# Patient Record
Sex: Male | Born: 1945 | Race: Black or African American | Hispanic: No | Marital: Married | Smoking: Former smoker
Health system: Southern US, Community
[De-identification: ages and names within clinical notes are randomized; demographics above are authoritative.]

## PROBLEM LIST (undated history)

## (undated) DIAGNOSIS — M109 Gout, unspecified: Secondary | ICD-10-CM

## (undated) DIAGNOSIS — I10 Essential (primary) hypertension: Secondary | ICD-10-CM

## (undated) DIAGNOSIS — R0602 Shortness of breath: Secondary | ICD-10-CM

## (undated) DIAGNOSIS — M1990 Unspecified osteoarthritis, unspecified site: Secondary | ICD-10-CM

## (undated) DIAGNOSIS — K219 Gastro-esophageal reflux disease without esophagitis: Secondary | ICD-10-CM

## (undated) DIAGNOSIS — G4730 Sleep apnea, unspecified: Secondary | ICD-10-CM

## (undated) DIAGNOSIS — F32A Depression, unspecified: Secondary | ICD-10-CM

## (undated) DIAGNOSIS — F4310 Post-traumatic stress disorder, unspecified: Secondary | ICD-10-CM

## (undated) HISTORY — PX: ABDOMINAL SURGERY: SHX537

## (undated) HISTORY — PX: KNEE ARTHROSCOPY: SUR90

## (undated) HISTORY — DX: Essential (primary) hypertension: I10

## (undated) HISTORY — DX: Post-traumatic stress disorder, unspecified: F43.10

---

## 1964-10-04 DIAGNOSIS — Z77098 Contact with and (suspected) exposure to other hazardous, chiefly nonmedicinal, chemicals: Secondary | ICD-10-CM | POA: Insufficient documentation

## 2006-04-04 HISTORY — PX: COLONOSCOPY: SHX174

## 2012-03-01 ENCOUNTER — Encounter: Payer: Self-pay | Admitting: Gastroenterology

## 2012-03-01 ENCOUNTER — Ambulatory Visit (INDEPENDENT_AMBULATORY_CARE_PROVIDER_SITE_OTHER): Payer: Medicare Other | Admitting: Gastroenterology

## 2012-03-01 VITALS — BP 131/79 | HR 81 | Temp 97.6°F | Ht 72.0 in | Wt 283.0 lb

## 2012-03-01 DIAGNOSIS — R1310 Dysphagia, unspecified: Secondary | ICD-10-CM

## 2012-03-01 DIAGNOSIS — K625 Hemorrhage of anus and rectum: Secondary | ICD-10-CM

## 2012-03-01 MED ORDER — HYDROCORTISONE ACETATE 25 MG RE SUPP: 25.0000 mg | Freq: Two times a day (BID) | RECTAL | Status: DC

## 2012-03-01 MED ORDER — PEG 3350-KCL-NA BICARB-NACL 420 G PO SOLR: 4000.0000 mL | ORAL | Status: DC

## 2012-03-01 NOTE — Assessment & Plan Note (Signed)
In the setting of GERD. Just recently started on Prilosec 40 mg daily. Notes solid food dysphagia and difficulty with water at times. Question secondary to uncontrolled GERD, unable to rule out esophageal web, ring, or stricture. Low likelihood of malignancy.   Proceed with upper endoscopy at time of TCS in the near future with Dr. Darrick Penna. The risks, benefits, and alternatives have been discussed in detail with patient. They have stated understanding and desire to proceed.  PROPOFOL DUE TO ETOH

## 2012-03-01 NOTE — Assessment & Plan Note (Addendum)
67 year old male with hx of polyps on prior colonoscopy, now presenting with low-volume hematochezia in the presence of straining. Denies changes in bowel habits, no rectal pain or discomfort. Hgb 15 per PCP office notes. Last TCS in [place] in 2008; pt notes he was told to update in 5 years. Likely benign anorectal source, but he needs further lower GI evaluation in the near future. Will provide short course of Anusol suppositories in interim.   Proceed with colonoscopy with Dr. Darrick Penna in the near future. The risks, benefits, and alternatives have been discussed in detail with the patient. They state understanding and desire to proceed.  PROPOFOL DUE TO ETOH USE  UPDATE AS OF 1/29: Received op notes and path from [place]. TCS in March 2008 with multiple colon polyps,  path: fragment of serrated adenoma, tublular adenomas

## 2012-03-01 NOTE — Progress Notes (Signed)
Faxed to PCP

## 2012-03-01 NOTE — Patient Instructions (Addendum)
We have set you up for a colonoscopy and upper endoscopy with Dr. Darrick Penna in the near future.  Continue to take Prilosec daily, 30 minutes before breakfast.   I have sent in a prescription for Anusol suppositories to take twice a day for 6 days. This is because of the rectal bleeding.

## 2012-03-01 NOTE — Progress Notes (Addendum)
Primary Care Physician:  Josefa Half, MD Primary Gastroenterologist:  Dr. Darrick Penna   Chief Complaint  Patient presents with  . Rectal Bleeding    HPI:   Tyler 67 year old Hopkins presenting today at the request of Dr. Wende Crease secondary to rectal bleeding. Last TCS in 2008 in [place]. Notes completed at [facility]. Pt states he had 3 small polyps. Believes he was due for surveillance in 5 years. Last Saturday noted blood in stool, has happened one time since. +paper hematochezia and in toilet. States somewhat straining. Denies constipation or diarrhea. No rectal pain, discomfort, itching. Denies hemorrhoid flare. States not really hungry the past few days, not eating a lot. Denies any nausea or vomiting. Went rabbit hunting last week and got light-headed when went from a sitting to standing position. No other symptoms. Hx of hypertension. Decent appetite, wt is stable. Eating Tums for the last 2 weeks, loves spicy foods. Just started Prilosec a few days ago. No longer needing Tums. +dysphagia with water, +solid food dysphagia, feels like "hangs up in middle of chest". States had an Upper GI at the [facility] about a year ago. No prior EGD.    Past Medical History  Diagnosis Date  . Hypertension   . PTSD (post-traumatic stress disorder)     Was in [place] 1966[place]1967, exposed to Agent [employer]    Past Surgical History  Procedure Date  . Colonoscopy     about 2008/removed three polyps  . Abdominal surgery     had chisel embedded in abdomen [place] [employer]    Current Outpatient Prescriptions  Medication Sig Dispense Refill  . amLODipine (NORVASC) 5 MG tablet Take 5 mg by mouth daily.      . hydrochlorothiazide (HYDRODIURIL) 25 MG tablet Take 25 mg by mouth daily.      Marland Kitchen lisinopril (PRINIVIL,ZESTRIL) 10 MG tablet Take 10 mg by mouth daily.      Marland Kitchen omeprazole (PRILOSEC) 40 MG capsule Take 40 mg by mouth daily.         Allergies as of 03/01/2012  . (No  Known Allergies)    Family History  Problem Relation Age of Onset  . Colon cancer Neg Hx     History   Social History  . Marital Status: Married    Spouse Name: [name]    Number of Children: [name]  . Years of Education: [name]   Occupational History  . retired    Social History Main Topics  . Smoking status: Never Smoker   . Smokeless tobacco: Not on file  . Alcohol Use: Yes     Comment: drinks a few beers a day, states averages to 6 pack a week   . Drug Use: No  . Sexually Active: Not on file   Other Topics Concern  . Not on file   Social History Narrative  . No narrative on file    Review of Systems: Gen: see HPI CV: Denies chest pain, heart palpitations, peripheral edema, syncope.  Resp: +DOE  GI: SEE HPI GU : +urinary hesitancy, "taking pills" MS: right knee pain  Derm: +dry skin, shoulders  Psych: PTSD Heme: Denies bruising, bleeding, and enlarged lymph nodes.  Physical Exam: BP 131/79  Pulse 81  Temp 97.6 F (36.4 C) (Oral)  Ht 6' (1.829 m)  Wt 283 lb (128.368 kg)  BMI 38.38 kg/m2 General:   Alert and oriented. Tyler and cooperative. Well-nourished and well-developed.  Head:  Normocephalic and atraumatic. Eyes:  Without icterus, sclera clear  and conjunctiva pink.  Ears:  Normal auditory acuity. Nose:  No deformity, discharge,  or lesions. Mouth:  No deformity or lesions, oral mucosa pink.  Neck:  Supple, without mass or thyromegaly. Lungs:  Clear to auscultation bilaterally. No wheezes, rales, or rhonchi. No distress.  Heart:  S1, S2 present without murmurs appreciated.  Abdomen:  +BS, soft, mild TTP epigastric region and non-distended. No HSM noted. No guarding or rebound. No masses appreciated.  Rectal:  Deferred  Msk:  Symmetrical without gross deformities. Normal posture. Pulses:  Normal pulses noted. Extremities:  Without clubbing or edema. Neurologic:  Alert and  oriented x4;  grossly normal neurologically. Skin:  Intact without significant  lesions or rashes. Cervical Nodes:  No significant cervical adenopathy. Psych:  Alert and cooperative. Normal mood and affect.   REVIEWED.

## 2012-03-02 ENCOUNTER — Encounter (HOSPITAL_COMMUNITY): Payer: Self-pay | Admitting: Pharmacy Technician

## 2012-03-03 ENCOUNTER — Encounter: Payer: Self-pay | Admitting: Gastroenterology

## 2012-03-03 NOTE — Patient Instructions (Addendum)
20    Your procedure is scheduled on: 03/09/2012  Report to [facility] at 8:00    AM.  Call this number if you have problems the morning of surgery: [phone]   Remember:   Do not drink or eat food:After Midnight.    Clear liquids include soda, tea, black coffee, apple or grape juice, broth.  Take these medicines the morning of surgery with A SIP OF WATER: AMLODIPINE, LISINOPRIL AND OMEPRAZOLE   Do not wear jewelry, make-up or nail polish.  Do not wear lotions, powders, or perfumes. You may wear deodorant.  Do not shave 48 hours prior to surgery. Men may shave face and neck.  Do not bring valuables to the hospital.  Contacts, dentures or bridgework may not be worn into surgery.  Leave suitcase in the car. After surgery it may be brought to your room.  For patients admitted to the hospital, checkout time is 11:00 AM the day of discharge.   Patients discharged the day of surgery will not be allowed to drive home.  Name and phone number of your driver:    Please read over the following fact sheets that you were given: Pain Booklet, Lab Information and Anesthesia Post-op Instructions   Endoscopy Care After Please read the instructions outlined below and refer to this sheet in the next few weeks. These discharge instructions provide you with general information on caring for yourself after you leave the hospital. Your doctor may also give you specific instructions. While your treatment has been planned according to the most current medical practices available, unavoidable complications occasionally occur. If you have any problems or questions after discharge, please call your doctor. HOME CARE INSTRUCTIONS Activity  You may resume your regular activity but move at a slower pace for the next 24 hours.   Take frequent rest periods for the next 24 hours.   Walking will help expel (get rid of) the air and reduce the bloated feeling in your abdomen.   No driving for 24 hours (because of the  anesthesia (medicine) used during the test).   You may shower.   Do not sign any important legal documents or operate any machinery for 24 hours (because of the anesthesia used during the test).  Nutrition  Drink plenty of fluids.   You may resume your normal diet.   Begin with a light meal and progress to your normal diet.   Avoid alcoholic beverages for 24 hours or as instructed by your caregiver.  Medications You may resume your normal medications unless your caregiver tells you otherwise. What you can expect today  You may experience abdominal discomfort such as a feeling of fullness or "gas" pains.   You may experience a sore throat for 2 to 3 days. This is normal. Gargling with salt water may help this.  Follow-up Your doctor will discuss the results of your test with you. SEEK IMMEDIATE MEDICAL CARE IF:  You have excessive nausea (feeling sick to your stomach) and/or vomiting.   You have severe abdominal pain and distention (swelling).   You have trouble swallowing.   You have a temperature over 100 F (37.8 C).   You have rectal bleeding or vomiting of blood.  Document Released: 09/04/2003 Document Revised: 01/09/2011 Document Reviewed: 03/17/2007  Colonoscopy Care After Read the instructions outlined below and refer to this sheet in the next few weeks. These discharge instructions provide you with general information on caring for yourself after you leave the hospital. Your doctor may  also give you specific instructions. While your treatment has been planned according to the most current medical practices available, unavoidable complications occasionally occur. If you have any problems or questions after discharge, call your doctor. HOME CARE INSTRUCTIONS ACTIVITY:  You may resume your regular activity, but move at a slower pace for the next 24 hours.  Take frequent rest periods for the next 24 hours.  Walking will help get rid of the air and reduce the bloated  feeling in your belly (abdomen).  No driving for 24 hours (because of the medicine (anesthesia) used during the test).  You may shower.  Do not sign any important legal documents or operate any machinery for 24 hours (because of the anesthesia used during the test). NUTRITION:  Drink plenty of fluids.  You may resume your normal diet as instructed by your doctor.  Begin with a light meal and progress to your normal diet. Heavy or fried foods are harder to digest and may make you feel sick to your stomach (nauseated).  Avoid alcoholic beverages for 24 hours or as instructed. MEDICATIONS:  You may resume your normal medications unless your doctor tells you otherwise. WHAT TO EXPECT TODAY:  Some feelings of bloating in the abdomen.  Passage of more gas than usual.  Spotting of blood in your stool or on the toilet paper. IF YOU HAD POLYPS REMOVED DURING THE COLONOSCOPY:  No aspirin products for 7 days or as instructed.  No alcohol for 7 days or as instructed.  Eat a soft diet for the next 24 hours. FINDING OUT THE RESULTS OF YOUR TEST Not all test results are available during your visit. If your test results are not back during the visit, make an appointment with your caregiver to find out the results. Do not assume everything is normal if you have not heard from your caregiver or the medical facility. It is important for you to follow up on all of your test results.  SEEK IMMEDIATE MEDICAL CARE IF:  You have more than a spotting of blood in your stool.  Your belly is swollen (abdominal distention).  You are nauseated or vomiting.  You have a fever.  You have abdominal pain or discomfort that is severe or gets worse throughout the day. Document Released: 09/04/2003 Document Revised: 04/14/2011 Document Reviewed: 09/02/2007 [facility] Patient Information 2013 [place], [place].

## 2012-03-04 ENCOUNTER — Encounter (HOSPITAL_COMMUNITY)
Admission: RE | Admit: 2012-03-04 | Discharge: 2012-03-04 | Disposition: A | Discharge status: Home / Self Care | Payer: BC Managed Care – PPO | Source: Ambulatory Visit | Attending: Gastroenterology | Admitting: Gastroenterology

## 2012-03-04 ENCOUNTER — Encounter (HOSPITAL_COMMUNITY): Payer: Self-pay

## 2012-03-04 HISTORY — DX: Shortness of breath: R06.02

## 2012-03-04 HISTORY — DX: Gout, unspecified: M10.9

## 2012-03-04 HISTORY — DX: Sleep apnea, unspecified: G47.30

## 2012-03-04 HISTORY — DX: Gastro-esophageal reflux disease without esophagitis: K21.9

## 2012-03-04 LAB — BASIC METABOLIC PANEL
CO2: 29 mEq/L (ref 19–32)
Chloride: 99 mEq/L (ref 96–112)
GFR calc Af Amer: 63 mL/min — ABNORMAL LOW (ref >90)
Potassium: 4.7 mEq/L (ref 3.5–5.1)
Sodium: 136 mEq/L (ref 135–145)

## 2012-03-04 LAB — HEMOGLOBIN AND HEMATOCRIT, BLOOD
HCT: 43 % (ref 39.0–52.0)
Hemoglobin: 14.4 g/dL (ref 13.0–17.0)

## 2012-03-05 NOTE — Progress Notes (Signed)
PLEASE CALL PT.  HIS BLOOD COUNT IS NORMAL. HIS BLOOD CHEMISTRIES SHOW SLIGHTLY DECREASED KIDNEY FUNCTION.

## 2012-03-08 NOTE — Progress Notes (Signed)
Called and informed pt.  

## 2012-03-09 ENCOUNTER — Encounter (HOSPITAL_COMMUNITY)
Admission: RE | Disposition: A | Discharge status: Home / Self Care | Payer: Self-pay | Source: Ambulatory Visit | Attending: Gastroenterology

## 2012-03-09 ENCOUNTER — Encounter (HOSPITAL_COMMUNITY): Payer: Self-pay

## 2012-03-09 ENCOUNTER — Ambulatory Visit (HOSPITAL_COMMUNITY): Payer: BC Managed Care – PPO | Admitting: Anesthesiology

## 2012-03-09 ENCOUNTER — Encounter (HOSPITAL_COMMUNITY): Payer: Self-pay | Admitting: Anesthesiology

## 2012-03-09 ENCOUNTER — Ambulatory Visit (HOSPITAL_COMMUNITY)
Admission: RE | Admit: 2012-03-09 | Discharge: 2012-03-09 | Disposition: A | Discharge status: Home / Self Care | Payer: BC Managed Care – PPO | Source: Ambulatory Visit | Attending: Gastroenterology | Admitting: Gastroenterology

## 2012-03-09 DIAGNOSIS — R1310 Dysphagia, unspecified: Secondary | ICD-10-CM

## 2012-03-09 DIAGNOSIS — K625 Hemorrhage of anus and rectum: Secondary | ICD-10-CM

## 2012-03-09 DIAGNOSIS — D128 Benign neoplasm of rectum: Secondary | ICD-10-CM | POA: Insufficient documentation

## 2012-03-09 DIAGNOSIS — K648 Other hemorrhoids: Secondary | ICD-10-CM

## 2012-03-09 DIAGNOSIS — K2980 Duodenitis without bleeding: Secondary | ICD-10-CM | POA: Insufficient documentation

## 2012-03-09 DIAGNOSIS — Z860100 Personal history of colon polyps, unspecified: Secondary | ICD-10-CM | POA: Insufficient documentation

## 2012-03-09 DIAGNOSIS — Z79899 Other long term (current) drug therapy: Secondary | ICD-10-CM | POA: Insufficient documentation

## 2012-03-09 DIAGNOSIS — K2990 Gastroduodenitis, unspecified, without bleeding: Secondary | ICD-10-CM

## 2012-03-09 DIAGNOSIS — K222 Esophageal obstruction: Secondary | ICD-10-CM | POA: Insufficient documentation

## 2012-03-09 DIAGNOSIS — Z01810 Encounter for preprocedural cardiovascular examination: Secondary | ICD-10-CM | POA: Insufficient documentation

## 2012-03-09 DIAGNOSIS — D126 Benign neoplasm of colon, unspecified: Secondary | ICD-10-CM

## 2012-03-09 DIAGNOSIS — K2970 Gastritis, unspecified, without bleeding: Secondary | ICD-10-CM

## 2012-03-09 DIAGNOSIS — K449 Diaphragmatic hernia without obstruction or gangrene: Secondary | ICD-10-CM | POA: Insufficient documentation

## 2012-03-09 DIAGNOSIS — K2940 Chronic atrophic gastritis without bleeding: Secondary | ICD-10-CM | POA: Insufficient documentation

## 2012-03-09 DIAGNOSIS — I10 Essential (primary) hypertension: Secondary | ICD-10-CM | POA: Insufficient documentation

## 2012-03-09 DIAGNOSIS — Z01812 Encounter for preprocedural laboratory examination: Secondary | ICD-10-CM | POA: Insufficient documentation

## 2012-03-09 HISTORY — PX: COLONOSCOPY WITH PROPOFOL: SHX5780

## 2012-03-09 HISTORY — PX: POLYPECTOMY: SHX5525

## 2012-03-09 HISTORY — PX: ESOPHAGOGASTRODUODENOSCOPY (EGD) WITH PROPOFOL: SHX5813

## 2012-03-09 HISTORY — PX: SAVORY DILATION: SHX5439

## 2012-03-09 SURGERY — COLONOSCOPY WITH PROPOFOL
Anesthesia: Monitor Anesthesia Care

## 2012-03-09 MED ORDER — MIDAZOLAM HCL 2 MG/2ML IJ SOLN
INTRAMUSCULAR | Status: AC
  Filled 2012-03-09: qty 2

## 2012-03-09 MED ORDER — ONDANSETRON HCL 4 MG/2ML IJ SOLN: 4.0000 mg | Freq: Once | INTRAMUSCULAR | Status: DC | PRN

## 2012-03-09 MED ORDER — PROPOFOL INFUSION 10 MG/ML OPTIME
INTRAVENOUS | Status: DC | PRN
  Administered 2012-03-09: 75 ug/kg/min via INTRAVENOUS
  Administered 2012-03-09: 11:00:00 via INTRAVENOUS

## 2012-03-09 MED ORDER — FENTANYL CITRATE 0.05 MG/ML IJ SOLN
INTRAMUSCULAR | Status: AC
  Filled 2012-03-09: qty 2

## 2012-03-09 MED ORDER — MIDAZOLAM HCL 2 MG/2ML IJ SOLN
1.0000 mg | INTRAMUSCULAR | Status: DC | PRN
  Administered 2012-03-09 (×2): 2 mg via INTRAVENOUS

## 2012-03-09 MED ORDER — ONDANSETRON HCL 4 MG/2ML IJ SOLN
4.0000 mg | Freq: Once | INTRAMUSCULAR | Status: AC
  Administered 2012-03-09: 4 mg via INTRAVENOUS

## 2012-03-09 MED ORDER — FENTANYL CITRATE 0.05 MG/ML IJ SOLN
INTRAMUSCULAR | Status: DC | PRN
  Administered 2012-03-09: 50 ug via INTRAVENOUS

## 2012-03-09 MED ORDER — ONDANSETRON HCL 4 MG/2ML IJ SOLN
INTRAMUSCULAR | Status: AC
  Filled 2012-03-09: qty 2

## 2012-03-09 MED ORDER — FENTANYL CITRATE 0.05 MG/ML IJ SOLN
25.0000 ug | INTRAMUSCULAR | Status: DC | PRN
  Administered 2012-03-09 (×2): 25 ug via INTRAVENOUS

## 2012-03-09 MED ORDER — BUTAMBEN-TETRACAINE-BENZOCAINE 2-2-14 % EX AERO
1.0000 | INHALATION_SPRAY | Freq: Once | CUTANEOUS | Status: AC
  Administered 2012-03-09: 1 via TOPICAL
  Filled 2012-03-09: qty 56

## 2012-03-09 MED ORDER — PROPOFOL 10 MG/ML IV EMUL
INTRAVENOUS | Status: AC
  Filled 2012-03-09: qty 20

## 2012-03-09 MED ORDER — GLYCOPYRROLATE 0.2 MG/ML IJ SOLN
0.2000 mg | Freq: Once | INTRAMUSCULAR | Status: AC
  Administered 2012-03-09: 0.2 mg via INTRAVENOUS

## 2012-03-09 MED ORDER — FENTANYL CITRATE 0.05 MG/ML IJ SOLN: 25.0000 ug | INTRAMUSCULAR | Status: DC | PRN

## 2012-03-09 MED ORDER — STERILE WATER FOR IRRIGATION IR SOLN
Status: DC | PRN
  Administered 2012-03-09: 11:00:00

## 2012-03-09 MED ORDER — PROPOFOL 10 MG/ML IV BOLUS
INTRAVENOUS | Status: DC | PRN
  Administered 2012-03-09: 20 mg via INTRAVENOUS

## 2012-03-09 MED ORDER — LACTATED RINGERS IV SOLN
INTRAVENOUS | Status: DC
  Administered 2012-03-09: 1000 mL via INTRAVENOUS

## 2012-03-09 MED ORDER — GLYCOPYRROLATE 0.2 MG/ML IJ SOLN
INTRAMUSCULAR | Status: AC
  Filled 2012-03-09: qty 1

## 2012-03-09 MED ORDER — WATER FOR IRRIGATION, STERILE IR SOLN
Status: DC | PRN
  Administered 2012-03-09: 1000 mL

## 2012-03-09 SURGICAL SUPPLY — 24 items
BLOCK BITE 60FR ADLT L/F BLUE (MISCELLANEOUS) ×2 IMPLANT
ELECT REM PT RETURN 9FT ADLT (ELECTROSURGICAL) ×2
ELECTRODE REM PT RTRN 9FT ADLT (ELECTROSURGICAL) ×1 IMPLANT
FCP BXJMBJMB 240X2.8X (CUTTING FORCEPS) ×1
FLOOR PAD 36X40 (MISCELLANEOUS) ×2
FORCEP RJ3 GP 1.8X160 W-NEEDLE (CUTTING FORCEPS) IMPLANT
FORCEPS BIOP RAD 4 LRG CAP 4 (CUTTING FORCEPS) ×4 IMPLANT
FORCEPS BIOP RJ4 240 W/NDL (CUTTING FORCEPS) ×1
FORCEPS BXJMBJMB 240X2.8X (CUTTING FORCEPS) ×1 IMPLANT
INJECTOR/SNARE I SNARE (MISCELLANEOUS) ×2 IMPLANT
LUBRICANT JELLY 4.5OZ STERILE (MISCELLANEOUS) ×2 IMPLANT
MANIFOLD NEPTUNE II (INSTRUMENTS) ×2 IMPLANT
NEEDLE SCLEROTHERAPY 25GX240 (NEEDLE) ×2 IMPLANT
PAD FLOOR 36X40 (MISCELLANEOUS) ×1 IMPLANT
PROBE APC STR FIRE (PROBE) IMPLANT
PROBE INJECTION GOLD (MISCELLANEOUS)
PROBE INJECTION GOLD 7FR (MISCELLANEOUS) IMPLANT
SNARE ROTATE MED OVAL 20MM (MISCELLANEOUS) IMPLANT
SNARE SHORT THROW 13M SML OVAL (MISCELLANEOUS) ×2 IMPLANT
SYR 50ML LL SCALE MARK (SYRINGE) ×2 IMPLANT
TRAP SPECIMEN MUCOUS 40CC (MISCELLANEOUS) ×2 IMPLANT
TUBING ENDO SMARTCAP PENTAX (MISCELLANEOUS) ×2 IMPLANT
TUBING IRRIGATION ENDOGATOR (MISCELLANEOUS) ×2 IMPLANT
WATER STERILE IRR 1000ML POUR (IV SOLUTION) ×4 IMPLANT

## 2012-03-09 NOTE — Anesthesia Preprocedure Evaluation (Signed)
Anesthesia Evaluation  Patient identified by MRN, date of birth, ID band Patient awake    Reviewed: Allergy & Precautions, H&P , NPO status , Patient's Chart, lab work & pertinent test results  Airway Mallampati: II TM Distance: >3 FB     Dental  (+) Teeth Intact   Pulmonary shortness of breath and with exertion, sleep apnea , former smoker,  breath sounds clear to auscultation        Cardiovascular hypertension, Pt. on medications Rhythm:Regular Rate:Normal     Neuro/Psych PSYCHIATRIC DISORDERS    GI/Hepatic GERD-  Medicated,  Endo/Other    Renal/GU      Musculoskeletal   Abdominal   Peds  Hematology   Anesthesia Other Findings   Reproductive/Obstetrics                           Anesthesia Physical Anesthesia Plan  ASA: III  Anesthesia Plan: MAC   Post-op Pain Management:    Induction: Intravenous  Airway Management Planned: Simple Face Mask  Additional Equipment:   Intra-op Plan:   Post-operative Plan:   Informed Consent: I have reviewed the patients History and Physical, chart, labs and discussed the procedure including the risks, benefits and alternatives for the proposed anesthesia with the patient or authorized representative who has indicated his/her understanding and acceptance.     Plan Discussed with:   Anesthesia Plan Comments:         Anesthesia Quick Evaluation

## 2012-03-09 NOTE — H&P (Signed)
  Primary Care Physician:  Josefa Half, MD Primary Gastroenterologist:  Dr. Darrick Penna  Pre-Procedure History & Physical: HPI:  Tyler Hopkins is a 67 y.o. male here for  BRBPR/DYSPHAGIA.   Past Medical History  Diagnosis Date  . Hypertension   . PTSD (post-traumatic stress disorder)     Was in [place] 1966-1967, exposed to [employer]  . Shortness of breath   . Sleep apnea     pt has sleep apnea but "could not wear machine"  . GERD (gastroesophageal reflux disease)   . Gout     Past Surgical History  Procedure Date  . Colonoscopy March 2008    received op notes from [place], [place]. multiple colon polyps,  path: fragment of serrated adenoma, tublular adenomas  . Abdominal surgery     had chisel embedded in abdomen [place] working [place] the [employer]  . Knee arthroscopy     right knee    Prior to Admission medications   Medication Sig Start Date End Date Taking? Authorizing Provider  amLODipine (NORVASC) 5 MG tablet Take 5 mg by mouth daily.   Yes Historical Provider, MD  hydrochlorothiazide (HYDRODIURIL) 25 MG tablet Take 25 mg by mouth daily.   Yes Historical Provider, MD  hydrocortisone (ANUSOL-HC) 25 MG suppository Place 1 suppository (25 mg total) rectally every 12 (twelve) hours. For 6 days. For rectal bleeding. 03/01/12  Yes Nira Retort, NP  lisinopril (PRINIVIL,ZESTRIL) 10 MG tablet Take 10 mg by mouth daily.   Yes Historical Provider, MD  omeprazole (PRILOSEC) 40 MG capsule Take 40 mg by mouth daily.  02/26/12  Yes Historical Provider, MD    Allergies as of 03/01/2012  . (No Known Allergies)    Family History  Problem Relation Age of Onset  . Colon cancer Neg Hx     History   Social History  . Marital Status: Married    Spouse Name: [name]    Number of Children: [name]  . Years of Education: [name]   Occupational History  . retired    Social History Main Topics  . Smoking status: Former Smoker -- 0.2 packs/day for 6 years    Types: Cigarettes  . Smokeless  tobacco: Former Neurosurgeon    Quit date: 03/05/1983  . Alcohol Use: Yes     Comment: drinks a few beers a day, states averages to 6 pack a week   . Drug Use: No  . Sexually Active: Yes    Birth Control/ Protection: None   Other Topics Concern  . Not on file   Social History Narrative  . No narrative on file    Review of Systems: See HPI, otherwise negative ROS   Physical Exam: BP 126/82  Pulse 100  Temp 98.1 F (36.7 C) (Oral)  Resp 11  SpO2 99% General:   Alert,  pleasant and cooperative in NAD Head:  Normocephalic and atraumatic. Neck:  Supple; Lungs:  Clear throughout to auscultation.    Heart:  Regular rate and rhythm. Abdomen:  Soft, nontender and nondistended. Normal bowel sounds, without guarding, and without rebound.   Neurologic:  Alert and  oriented x4;  grossly normal neurologically.  Impression/Plan:     DYSPHAGIA/ BRBPR.    PLAN:  EGD/DIL/tcs TODAY

## 2012-03-09 NOTE — Op Note (Signed)
[facility] [address] [place], [address]   COLONOSCOPY PROCEDURE REPORT  PATIENT: Tyler Hopkins, Tyler Hopkins  MR#: [id number] BIRTHDATE: 05-14-1945 , 66  yrs. old GENDER: Male ENDOSCOPIST: Jonette Eva, MD REFERRED [id number] Wende Crease, M.D. PROCEDURE DATE:  03/09/2012  PROCEDURE:   Colonoscopy with cold biopsy polypectomy INDICATIONS:Rectal Bleeding and High risk patient with personal history of colonic polyps. MEDICATIONS: MAC sedation, administered by CRNA  DESCRIPTION OF PROCEDURE:    Physical exam was performed.  Informed consent was obtained from the patient after explaining the benefits, risks, and alternatives to procedure.  The patient was connected to monitor and placed in left lateral position. Continuous oxygen was provided by nasal cannula and IV medicine administered through an indwelling cannula.  After administration of sedation and rectal exam, the patients rectum was intubated and the     colonoscope was advanced under direct visualization to the cecum.  The scope was removed slowly by carefully examining the color, texture, anatomy, and integrity mucosa on the way out.  The patient was recovered in endoscopy and discharged home in satisfactory condition.    COLON FINDINGS: Two smooth sessile polyps ranging between 3-38mm in size were found at the hepatic flexure and in the rectum.  A polypectomy was performed with cold forceps.  , The colon was otherwise normal.  There was no diverticulosis, inflammation, or cancers unless previously stated.  , and Small internal hemorrhoids were found.  PREP QUALITY: good. CECAL W/D TIME: 10 minutes  COMPLICATIONS: None  ENDOSCOPIC IMPRESSION: 1.   Two SMALL COLON POLYPS 2.   RECTAL BLEEDING DUE TO Small internal hemorrhoids  RECOMMENDATIONS: AWAIT BIOPSY HIGH FIBER DIET TCS IN 5 YEARS       _______________________________ Rosalie DoctorJonette Eva, MD 03/09/2012 12:10 PM

## 2012-03-09 NOTE — Anesthesia Postprocedure Evaluation (Signed)
  Anesthesia Post-op Note  Patient: Tyler Hopkins  Procedure(s) Performed: Procedure(s) (LRB) with comments: COLONOSCOPY WITH PROPOFOL ([name]) - in cecum at 1114 out at 1124 total time = 10 minutes ESOPHAGOGASTRODUODENOSCOPY (EGD) WITH PROPOFOL ([name]) - start at 11:31 SAVORY DILATION ([name]) - 14/15/16/17 POLYPECTOMY ([name])  Patient Location: PACU  Anesthesia Type:MAC  Level of Consciousness: awake and patient cooperative  Airway and Oxygen Therapy: Patient Spontanous Breathing and Patient connected to face mask oxygen  Post-op Pain: none  Post-op Assessment: Post-op Vital signs reviewed, Patient's Cardiovascular Status Stable, Respiratory Function Stable, Patent Airway and Pain level controlled  Post-op Vital Signs: Reviewed and stable  Complications: No apparent anesthesia complications

## 2012-03-09 NOTE — Op Note (Signed)
[facility] [address] [place], [address]   ENDOSCOPY PROCEDURE REPORT  PATIENT: Jamarie, Mussa  MR#: [id number] BIRTHDATE: [birth year] , 66  yrs. old GENDER: Male  ENDOSCOPIST: Jonette Eva, MD REFFERED [id number] Wende Crease, M.D.  PROCEDURE DATE:  03/09/2012 PROCEDURE:   EGD with biopsy and EGD with dilatation over guidewire  INDICATIONS:1.  dysphagia. MEDICATIONS: MAC sedation, administered by CRNA TOPICAL ANESTHETIC: Cetacaine Spray  DESCRIPTION OF PROCEDURE:   After the risks benefits and alternatives of the procedure were thoroughly explained, informed consent was obtained.  The     endoscope was introduced through the mouth and advanced to the second portion of the duodenum. The instrument was slowly withdrawn as the mucosa was carefully examined.  Prior to withdrawal of the scope, the guidwire was placed.  The esophagus was dilated successfully.  The patient was recovered in endoscopy and discharged home in satisfactory condition.   ESOPHAGUS: A moderately severe Schatzki ring was found at the gastroesophageal junction NARROWED LUMEN TO 13-14 MM.   A small hiatal hernia was noted.   STOMACH: Mild non-erosive gastritis (inflammation) was found in the gastric antrum.  Multiple biopsies were performed.   DUODENUM: A small was found nodule and in the duodenal bulb.  Multiple biopsies were performed.   The duodenal mucosa showed no abnormalities in the 2nd part of the duodenum. Dilation was then performed at the gastroesphageal junction  Dilator: Savary over guidewire Size(s): 14-17 mm Resistance: minimal TO MODERATE Heme: none Appearance: adequate  COMPLICATIONS: There were no complications.  ENDOSCOPIC IMPRESSION: 1.   Schatzki ring was found at the gastroesophageal junction 2.   Small hiatal hernia 3.   MILD Gastritis (inflammation) 4.   Nodule and in the duodenal bulb  RECOMMENDATIONS: CONTINUE OMEPRAZOLE. LOW FAT/HIGH FIBER DIET AWAIT  BIOPSY OPV IN 3 MOS. IF HE CONTINUES WITH DYSPHAGIA, PT WILL NEED BPE.     _______________________________ Rosalie DoctorJonette Eva, MD 03/09/2012 12:17 PM      PATIENT NAME:  Shanard, Treto MR#: [id number]

## 2012-03-09 NOTE — Transfer of Care (Signed)
Immediate Anesthesia Transfer of Care Note  Patient: Tyler Hopkins  Procedure(s) Performed: Procedure(s) (LRB) with comments: COLONOSCOPY WITH PROPOFOL ([name]) - in cecum at 1114 out at 1124 total time = 10 minutes ESOPHAGOGASTRODUODENOSCOPY (EGD) WITH PROPOFOL ([name]) - start at 11:31 SAVORY DILATION ([name]) - 14/15/16/17 POLYPECTOMY ([name])  Patient Location: PACU  Anesthesia Type:MAC  Level of Consciousness: awake and patient cooperative  Airway & Oxygen Therapy: Patient Spontanous Breathing and Patient connected to face mask oxygen  Post-op Assessment: Report given to PACU RN, Post -op Vital signs reviewed and stable and Patient moving all extremities  Post vital signs: Reviewed and stable  Complications: No apparent anesthesia complications

## 2012-03-10 ENCOUNTER — Encounter (HOSPITAL_COMMUNITY): Payer: Self-pay | Admitting: Gastroenterology

## 2012-03-11 ENCOUNTER — Telehealth: Payer: Self-pay | Admitting: Gastroenterology

## 2012-03-11 NOTE — Telephone Encounter (Signed)
Called and informed pt.  

## 2012-03-11 NOTE — Telephone Encounter (Signed)
Path faxed to PCP, appt made, recall made  

## 2012-03-11 NOTE — Telephone Encounter (Signed)
Please call pt. He had A simple adenoma removed from hIS colon. Please call pt. His stomach Bx shows gastritis. HIS SMALL BOWEL BIOPSIES SHOW DUODENITIS.    CONTINUE OMEPRAZOLE.  TAKE 30 MINUTES PRIOR TO YOUR FIRST MEAL.  AVOID ITEMS THAT TRIGGER GASTRITIS.  FOLLOW A HIGH FIBER/LOW FAT DIET. AVOID ITEMS THAT CAUSE BLOATING.   FOLLOW UP IN 3 MOS TO REASSESS YOUR SWALLOWING & RECTAL BLEEDING E30.  REPEAT COLONOSCOPY IN 5 YEARS.

## 2012-03-25 NOTE — Telephone Encounter (Signed)
Spoke with Ms Wilford, she is aware.

## 2012-03-25 NOTE — Telephone Encounter (Signed)
PLEASE CALL PT'S WIFE. I SPOKE WITH [employer] [place]: ANGELA THOMPSON & THE AMENDED [employer] WAS FAXED TODAY.

## 2012-06-01 NOTE — Progress Notes (Signed)
Pt has an appt 07/07/2012@2 :30pm

## 2012-06-01 NOTE — Progress Notes (Signed)
TCS JAN 2014 SIMPLE ADENOMA  EGD JAN 2014 GASTRITIS/DUODENITIS  REVIEWED.

## 2012-06-01 NOTE — Progress Notes (Signed)
OPV W/ SLF IN MAY 2014 E30 DYSPHAGIA, RECTAL BLEEDING   REVIEWED.

## 2012-06-09 ENCOUNTER — Ambulatory Visit: Payer: BC Managed Care – PPO | Admitting: Gastroenterology

## 2012-07-05 ENCOUNTER — Encounter: Payer: Self-pay | Admitting: Gastroenterology

## 2012-07-07 ENCOUNTER — Encounter: Payer: Self-pay | Admitting: Gastroenterology

## 2012-07-07 ENCOUNTER — Ambulatory Visit (INDEPENDENT_AMBULATORY_CARE_PROVIDER_SITE_OTHER): Payer: BC Managed Care – PPO | Admitting: Gastroenterology

## 2012-07-07 VITALS — BP 155/89 | HR 77 | Temp 97.8°F | Ht 71.0 in | Wt 287.8 lb

## 2012-07-07 DIAGNOSIS — D126 Benign neoplasm of colon, unspecified: Secondary | ICD-10-CM | POA: Insufficient documentation

## 2012-07-07 DIAGNOSIS — K625 Hemorrhage of anus and rectum: Secondary | ICD-10-CM

## 2012-07-07 DIAGNOSIS — R1310 Dysphagia, unspecified: Secondary | ICD-10-CM

## 2012-07-07 NOTE — Assessment & Plan Note (Signed)
SX RESOLVED.  LOW FAT DIET PPI DAILY OPV PRN

## 2012-07-07 NOTE — Assessment & Plan Note (Signed)
NEXT TCS IN 2019 

## 2012-07-07 NOTE — Patient Instructions (Addendum)
DRINK WATER TO KEEP YOUR URINE LIGHT YELLOW.  FOLLOW A HIGH FIBER/LOW FAT DIET. SEE INFO BELOW.  FOLLOW UP AS NEEDED.  YOU SHOULD HAVE YOUR NEXT COLONOSCOPY IN FEB 2019.  Hemorrhoids Hemorrhoids are dilated (enlarged) veins around the rectum. Sometimes clots will form in the veins. This makes them swollen and painful. These are called thrombosed hemorrhoids. Causes of hemorrhoids include:  Constipation.   Straining to have a bowel movement.   HEAVY LIFTING  HOME CARE INSTRUCTIONS  Eat a well balanced diet and drink 6 to 8 glasses of water every day to avoid constipation. You may also use a bulk laxative.   Avoid straining to have bowel movements.   Keep anal area dry and clean.   Do not use a donut shaped pillow or sit on the toilet for long periods. This increases blood pooling and pain.   Move your bowels when your body has the urge; this will require less straining and will decrease pain and pressure.   High-Fiber Diet A high-fiber diet changes your normal diet to include more whole grains, legumes, fruits, and vegetables. Changes in the diet involve replacing refined carbohydrates with unrefined foods. The calorie level of the diet is essentially unchanged. The Dietary Reference Intake (recommended amount) for adult males is 38 grams per day. For adult females, it is 25 grams per day. Pregnant and lactating women should consume 28 grams of fiber per day. Fiber is the intact part of a plant that is not broken down during digestion. Functional fiber is fiber that has been isolated from the plant to provide a beneficial effect in the body. PURPOSE  Increase stool bulk.   Ease and regulate bowel movements.   Lower cholesterol.  INDICATIONS THAT YOU NEED MORE FIBER  Constipation and hemorrhoids.   Uncomplicated diverticulosis (intestine condition) and irritable bowel syndrome.   Weight management.   As a protective measure against hardening of the arteries  (atherosclerosis), diabetes, and cancer.   GUIDELINES FOR INCREASING FIBER IN THE DIET  Start adding fiber to the diet slowly. A gradual increase of about 5 more grams (2 slices of whole-wheat bread, 2 servings of most fruits or vegetables, or 1 bowl of high-fiber cereal) per day is best. Too rapid an increase in fiber may result in constipation, flatulence, and bloating.   Drink enough water and fluids to keep your urine clear or pale yellow. Water, juice, or caffeine-free drinks are recommended. Not drinking enough fluid may cause constipation.   Eat a variety of high-fiber foods rather than one type of fiber.   Try to increase your intake of fiber through using high-fiber foods rather than fiber pills or supplements that contain small amounts of fiber.   The goal is to change the types of food eaten. Do not supplement your present diet with high-fiber foods, but replace foods in your present diet.  INCLUDE A VARIETY OF FIBER SOURCES  Replace refined and processed grains with whole grains, canned fruits with fresh fruits, and incorporate other fiber sources. White rice, white breads, and most bakery goods contain little or no fiber.   Brown whole-grain rice, buckwheat oats, and many fruits and vegetables are all good sources of fiber. These include: broccoli, Brussels sprouts, cabbage, cauliflower, beets, sweet potatoes, white potatoes (skin on), carrots, tomatoes, eggplant, squash, berries, fresh fruits, and dried fruits.   Cereals appear to be the richest source of fiber. Cereal fiber is found in whole grains and bran. Bran is the fiber-rich outer coat of  cereal grain, which is largely removed in refining. In whole-grain cereals, the bran remains. In breakfast cereals, the largest amount of fiber is found in those with "bran" in their names. The fiber content is sometimes indicated on the label.   You may need to include additional fruits and vegetables each day.   In baking, for 1 cup  white flour, you may use the following substitutions:   1 cup whole-wheat flour minus 2 tablespoons.   1/2 cup white flour plus 1/2 cup whole-wheat flour.   Low-Fat Diet BREADS, CEREALS, PASTA, RICE, DRIED PEAS, AND BEANS These products are high in carbohydrates and most are low in fat. Therefore, they can be increased in the diet as substitutes for fatty foods. They too, however, contain calories and should not be eaten in excess. Cereals can be eaten for snacks as well as for breakfast.   FRUITS AND VEGETABLES It is good to eat fruits and vegetables. Besides being sources of fiber, both are rich in vitamins and some minerals. They help you get the daily allowances of these nutrients. Fruits and vegetables can be used for snacks and desserts.  MEATS Limit lean meat, chicken, [place], and fish to no more than 6 ounces per day. Beef, Pork, and Lamb Use lean cuts of beef, pork, and lamb. Lean cuts include:  Extra-lean ground beef.  Arm roast.  Sirloin tip.  Center-cut ham.  Round steak.  Loin chops.  Rump roast.  Tenderloin.  Trim all fat off the outside of meats before cooking. It is not necessary to severely decrease the intake of red meat, but lean choices should be made. Lean meat is rich in protein and contains a highly absorbable form of iron. Premenopausal women, in particular, should avoid reducing lean red meat because this could increase the risk for low red blood cells (iron-deficiency anemia).  Chicken and [place] These are good sources of protein. The fat of poultry can be reduced by removing the skin and underlying fat layers before cooking. Chicken and [place] can be substituted for lean red meat in the diet. Poultry should not be fried or covered with high-fat sauces. Fish and Shellfish Fish is a good source of protein. Shellfish contain cholesterol, but they usually are low in saturated fatty acids. The preparation of fish is important. Like chicken and [place], they should  not be fried or covered with high-fat sauces. EGGS Egg whites contain no fat or cholesterol. They can be eaten often. Try 1 to 2 egg whites instead of whole eggs in recipes or use egg substitutes that do not contain yolk. MILK AND DAIRY PRODUCTS Use skim or 1% milk instead of 2% or whole milk. Decrease whole milk, natural, and processed cheeses. Use nonfat or low-fat (2%) cottage cheese or low-fat cheeses made from vegetable oils. Choose nonfat or low-fat (1 to 2%) yogurt. Experiment with evaporated skim milk in recipes that call for heavy cream. Substitute low-fat yogurt or low-fat cottage cheese for sour cream in dips and salad dressings. Have at least 2 servings of low-fat dairy products, such as 2 glasses of skim (or 1%) milk each day to help get your daily calcium intake. FATS AND OILS Reduce the total intake of fats, especially saturated fat. Butterfat, lard, and beef fats are high in saturated fat and cholesterol. These should be avoided as much as possible. Vegetable fats do not contain cholesterol, but certain vegetable fats, such as coconut oil, palm oil, and palm kernel oil are very high in saturated fats.  These should be limited. These fats are often used in bakery goods, processed foods, popcorn, oils, and nondairy creamers. Vegetable shortenings and some peanut butters contain hydrogenated oils, which are also saturated fats. Read the labels on these foods and check for saturated vegetable oils. Unsaturated vegetable oils and fats do not raise blood cholesterol. However, they should be limited because they are fats and are high in calories. Total fat should still be limited to 30% of your daily caloric intake. Desirable liquid vegetable oils are corn oil, cottonseed oil, olive oil, canola oil, safflower oil, soybean oil, and sunflower oil. Peanut oil is not as good, but small amounts are acceptable. Buy a heart-healthy tub margarine that has no partially hydrogenated oils in the ingredients.  Mayonnaise and salad dressings often are made from unsaturated fats, but they should also be limited because of their high calorie and fat content. Seeds, nuts, peanut butter, olives, and avocados are high in fat, but the fat is mainly the unsaturated type. These foods should be limited mainly to avoid excess calories and fat. OTHER EATING TIPS Snacks  Most sweets should be limited as snacks. They tend to be rich in calories and fats, and their caloric content outweighs their nutritional value. Some good choices in snacks are graham crackers, melba toast, soda crackers, bagels (no egg), English muffins, fruits, and vegetables. These snacks are preferable to snack crackers, [place] fries, TORTILLA CHIPS, and POTATO chips. Popcorn should be air-popped or cooked in small amounts of liquid vegetable oil. Desserts Eat fruit, low-fat yogurt, and fruit ices instead of pastries, cake, and cookies. Sherbet, angel food cake, gelatin dessert, frozen low-fat yogurt, or other frozen products that do not contain saturated fat (pure fruit juice bars, frozen ice pops) are also acceptable.  COOKING METHODS Choose those methods that use little or no fat. They include: Poaching.  Braising.  Steaming.  Grilling.  Baking.  Stir-frying.  Broiling.  Microwaving.  Foods can be cooked in a nonstick pan without added fat, or use a nonfat cooking spray in regular cookware. Limit fried foods and avoid frying in saturated fat. Add moisture to lean meats by using water, broth, cooking wines, and other nonfat or low-fat sauces along with the cooking methods mentioned above. Soups and stews should be chilled after cooking. The fat that forms on top after a few hours in the refrigerator should be skimmed off. When preparing meals, avoid using excess salt. Salt can contribute to raising blood pressure in some people.  EATING AWAY FROM HOME Order entres, potatoes, and vegetables without sauces or butter. When meat exceeds the  size of a deck of cards (3 to 4 ounces), the rest can be taken home for another meal. Choose vegetable or fruit salads and ask for low-calorie salad dressings to be served on the side. Use dressings sparingly. Limit high-fat toppings, such as bacon, crumbled eggs, cheese, sunflower seeds, and olives. Ask for heart-healthy tub margarine instead of butter.

## 2012-07-07 NOTE — Assessment & Plan Note (Signed)
DUE TO HEMORRHOIDS. SX RESOLVED WITH ANUSOL.  DRINK WATER  EAT FIBER AVOID CONSTIPATION DO NOT SIT ON COMMODE OPV PRN

## 2012-07-07 NOTE — Progress Notes (Signed)
Cc PCP 

## 2012-07-07 NOTE — Progress Notes (Signed)
  Subjective:    Patient ID: Tyler Hopkins, male    DOB: 11-02-1945, 67 y.o.   MRN: [id number]  Josefa Half, MD  HPI SWALLOWING OK. NO RECTAL BLEEDING, PAIN, PRESSURE, ITCHING, OR SOILING. BMs: EVERY AM  PT DENIES FEVER, CHILLS, nausea, vomiting, melena, diarrhea, constipation, abd pain, problems swallowing, problems with sedation, heartburn or indigestion.  Past Medical History  Diagnosis Date  . Hypertension   . PTSD (post-traumatic stress disorder)     Was in [place] 1966-1967, exposed to [employer]  . Shortness of breath   . Sleep apnea     pt has sleep apnea but "could not wear machine"  . GERD (gastroesophageal reflux disease)   . Gout     Past Surgical History  Procedure Laterality Date  . Colonoscopy  March 2008    received op notes from [place], [place]. multiple colon polyps,  path: fragment of serrated adenoma, tublular adenomas  . Abdominal surgery      had chisel embedded in abdomen [place] the [place]  . Knee arthroscopy      right knee  . Colonoscopy with propofol  03/09/2012    SLF:Two SMALL COLON POLYPS/RECTAL BLEEDING DUE TO Small internal hemorrhoids  . Esophagogastroduodenoscopy (egd) with propofol  03/09/2012    WUJ:[id number] ring was found at the gastroesophageal junction Small hiatal hernia/MILD Gastritis   . Savory dilation  03/09/2012    Procedure: SAVORY DILATION;  Surgeon: West Bali, MD;  Location: [facility];  Service: Endoscopy;  Laterality: [name];  14/15/16/17  . Polypectomy  03/09/2012    Procedure: POLYPECTOMY;  Surgeon: West Bali, MD;  Location: [facility];  Service: Endoscopy;  Laterality: [name];    No Known Allergies  Current Outpatient Prescriptions  Medication Sig Dispense Refill  . amLODipine (NORVASC) 5 MG tablet Take 5 mg by mouth daily.      . hydrochlorothiazide (HYDRODIURIL) 25 MG tablet Take 25 mg by mouth daily.      . hydrocortisone (ANUSOL-HC) 25 MG suppository Place 1 suppository (25 mg total) rectally every 12  (twelve) hours. For 6 days. For rectal bleeding. LAST DIOSE FEB 2014   . lisinopril (PRINIVIL,ZESTRIL) 10 MG tablet Take 10 mg by mouth daily.      Marland Kitchen omeprazole (PRILOSEC) 40 MG capsule Take 40 mg by mouth daily.         Review of Systems     Objective:   Physical Exam  Vitals reviewed. Constitutional: He is oriented to person, place, and time. He appears well-nourished. No distress.  HENT:  Head: Normocephalic and atraumatic.  Mouth/Throat: Oropharynx is clear and moist. No oropharyngeal exudate.  Eyes: Pupils are equal, round, and reactive to light. No scleral icterus.  Neck: Normal range of motion. Neck supple.  Cardiovascular: Normal rate, regular rhythm and normal heart sounds.   Pulmonary/Chest: Effort normal and breath sounds normal. No respiratory distress.  Abdominal: Soft. Bowel sounds are normal. He exhibits no distension. There is no tenderness.  Lymphadenopathy:    He has no cervical adenopathy.  Neurological: He is alert and oriented to person, place, and time.  NO FOCAL DEFICITS   Psychiatric: He has a normal mood and affect.          Assessment & Plan:

## 2012-07-26 NOTE — Progress Notes (Signed)
Reminder in epic °

## 2014-10-23 DIAGNOSIS — G8929 Other chronic pain: Secondary | ICD-10-CM | POA: Insufficient documentation

## 2014-10-23 DIAGNOSIS — M5442 Lumbago with sciatica, left side: Secondary | ICD-10-CM | POA: Insufficient documentation

## 2017-02-05 ENCOUNTER — Encounter: Payer: Self-pay | Admitting: Gastroenterology

## 2017-02-12 ENCOUNTER — Other Ambulatory Visit: Payer: Self-pay

## 2017-02-12 ENCOUNTER — Encounter (HOSPITAL_COMMUNITY): Payer: Self-pay | Admitting: Emergency Medicine

## 2017-02-12 ENCOUNTER — Emergency Department (HOSPITAL_COMMUNITY): Payer: Medicare HMO

## 2017-02-12 ENCOUNTER — Emergency Department (HOSPITAL_COMMUNITY)
Admission: EM | Admit: 2017-02-12 | Discharge: 2017-02-12 | Disposition: A | Discharge status: Home / Self Care | Payer: Medicare HMO | Attending: Emergency Medicine | Admitting: Emergency Medicine

## 2017-02-12 DIAGNOSIS — R109 Unspecified abdominal pain: Secondary | ICD-10-CM

## 2017-02-12 DIAGNOSIS — Z79899 Other long term (current) drug therapy: Secondary | ICD-10-CM | POA: Diagnosis not present

## 2017-02-12 DIAGNOSIS — I10 Essential (primary) hypertension: Secondary | ICD-10-CM | POA: Diagnosis not present

## 2017-02-12 DIAGNOSIS — Z87891 Personal history of nicotine dependence: Secondary | ICD-10-CM | POA: Diagnosis not present

## 2017-02-12 LAB — CBC WITH DIFFERENTIAL/PLATELET
Basophils Absolute: 0 10*3/uL (ref 0.0–0.1)
Basophils Relative: 0 %
Eosinophils Absolute: 0.1 10*3/uL (ref 0.0–0.7)
Eosinophils Relative: 1 %
HEMATOCRIT: 42.9 % (ref 39.0–52.0)
HEMOGLOBIN: 14.1 g/dL (ref 13.0–17.0)
LYMPHS ABS: 2.2 10*3/uL (ref 0.7–4.0)
Lymphocytes Relative: 31 %
MCH: 30.5 pg (ref 26.0–34.0)
MCHC: 32.9 g/dL (ref 30.0–36.0)
MCV: 92.7 fL (ref 78.0–100.0)
MONO ABS: 0.4 10*3/uL (ref 0.1–1.0)
Monocytes Relative: 5 %
NEUTROS ABS: 4.4 10*3/uL (ref 1.7–7.7)
NEUTROS PCT: 63 %
Platelets: 245 10*3/uL (ref 150–400)
RBC: 4.63 MIL/uL (ref 4.22–5.81)
RDW: 13.4 % (ref 11.5–15.5)
WBC: 7 10*3/uL (ref 4.0–10.5)

## 2017-02-12 LAB — URINALYSIS, ROUTINE W REFLEX MICROSCOPIC
Bilirubin Urine: NEGATIVE
Glucose, UA: NEGATIVE mg/dL
HGB URINE DIPSTICK: NEGATIVE
Ketones, ur: NEGATIVE mg/dL
NITRITE: NEGATIVE
Protein, ur: 30 mg/dL — AB
SPECIFIC GRAVITY, URINE: 1.025 (ref 1.005–1.030)
pH: 6 (ref 5.0–8.0)

## 2017-02-12 LAB — COMPREHENSIVE METABOLIC PANEL
ALK PHOS: 68 U/L (ref 38–126)
ALT: 27 U/L (ref 17–63)
AST: 49 U/L — ABNORMAL HIGH (ref 15–41)
Albumin: 4.1 g/dL (ref 3.5–5.0)
Anion gap: 13 (ref 5–15)
BILIRUBIN TOTAL: 0.5 mg/dL (ref 0.3–1.2)
BUN: 21 mg/dL — ABNORMAL HIGH (ref 6–20)
CALCIUM: 9.5 mg/dL (ref 8.9–10.3)
CO2: 26 mmol/L (ref 22–32)
Chloride: 99 mmol/L — ABNORMAL LOW (ref 101–111)
Creatinine, Ser: 1.29 mg/dL — ABNORMAL HIGH (ref 0.61–1.24)
GFR, EST NON AFRICAN AMERICAN: 54 mL/min — AB (ref >60)
GLUCOSE: 101 mg/dL — AB (ref 65–99)
Potassium: 4 mmol/L (ref 3.5–5.1)
Sodium: 138 mmol/L (ref 135–145)
TOTAL PROTEIN: 7.7 g/dL (ref 6.5–8.1)

## 2017-02-12 LAB — D-DIMER, QUANTITATIVE: D-Dimer, Quant: 0.44 ug/mL-FEU (ref 0.00–0.50)

## 2017-02-12 MED ORDER — HYDROMORPHONE HCL 1 MG/ML IJ SOLN
0.5000 mg | Freq: Once | INTRAMUSCULAR | Status: AC
  Administered 2017-02-12: 0.5 mg via INTRAVENOUS
  Filled 2017-02-12: qty 1

## 2017-02-12 MED ORDER — TRAMADOL HCL 50 MG PO TABS: 50.0000 mg | ORAL_TABLET | Freq: Four times a day (QID) | ORAL | 0 refills | Status: DC | PRN

## 2017-02-12 MED ORDER — HYDROMORPHONE HCL 1 MG/ML IJ SOLN
1.0000 mg | Freq: Once | INTRAMUSCULAR | Status: AC
  Administered 2017-02-12: 1 mg via INTRAVENOUS
  Filled 2017-02-12: qty 1

## 2017-02-12 MED ORDER — ONDANSETRON HCL 4 MG/2ML IJ SOLN
4.0000 mg | Freq: Once | INTRAMUSCULAR | Status: AC
  Administered 2017-02-12: 4 mg via INTRAVENOUS
  Filled 2017-02-12: qty 2

## 2017-02-12 NOTE — ED Provider Notes (Signed)
[facility] EMERGENCY DEPARTMENT Provider Note   CSN: [id number] Arrival date & time: 02/12/17  1559     History   Chief Complaint Chief Complaint  Patient presents with  . Flank Pain    HPI Tyler Hopkins is a 72 y.o. male.  Patient complains of left lower back pain.   The history is provided by the patient.  Flank Pain  This is a new problem. The current episode started 2 days ago. The problem occurs constantly. The problem has not changed since onset.Pertinent negatives include no chest pain, no abdominal pain and no headaches. The symptoms are aggravated by twisting. Nothing relieves the symptoms.    Past Medical History:  Diagnosis Date  . GERD (gastroesophageal reflux disease)   . Gout   . Hypertension   . PTSD (post-traumatic stress disorder)    Was in [place] 1966-1967, exposed to [employer]  . Shortness of breath   . Sleep apnea    pt has sleep apnea but "could not wear machine"    Patient Active Problem List   Diagnosis Date Noted  . Adenomatous polyp of colon 07/07/2012  . Dysphagia 03/01/2012  . Rectal bleeding 03/01/2012    Past Surgical History:  Procedure Laterality Date  . ABDOMINAL SURGERY     had chisel embedded in abdomen [place]  . COLONOSCOPY  March 2008   received op notes from [place], [place]. multiple colon polyps,  path: fragment of serrated adenoma, tublular adenomas  . COLONOSCOPY WITH PROPOFOL  03/09/2012   SLF:Two SMALL COLON POLYPS/RECTAL BLEEDING DUE TO Small internal hemorrhoids  . ESOPHAGOGASTRODUODENOSCOPY (EGD) WITH PROPOFOL  03/09/2012   VVO:HYWVPXTG ring was found at the gastroesophageal junction Small hiatal hernia/MILD Gastritis   . KNEE ARTHROSCOPY     right knee  . POLYPECTOMY  03/09/2012   Procedure: POLYPECTOMY;  Surgeon: Danie Binder, MD;  Location: [facility];  Service: Endoscopy;  Laterality: [name];  . SAVORY DILATION  03/09/2012   Procedure: SAVORY DILATION;  Surgeon: Danie Binder, MD;  Location:  [facility];  Service: Endoscopy;  Laterality: [name];  14/15/16/17       Home Medications    Prior to Admission medications   Medication Sig Start Date End Date Taking? Authorizing Provider  albuterol (PROVENTIL HFA;VENTOLIN HFA) 108 (90 Base) MCG/ACT inhaler Inhale 1-2 puffs into the lungs every 6 (six) hours as needed for wheezing or shortness of breath.   Yes [provider]  amLODipine (NORVASC) 5 MG tablet Take 5 mg by mouth daily.   Yes [provider]  budesonide-formoterol (SYMBICORT) 160-4.5 MCG/ACT inhaler Inhale 2 puffs into the lungs 2 (two) times daily.   Yes [provider]  hydrochlorothiazide (HYDRODIURIL) 25 MG tablet Take 25 mg by mouth daily.   Yes [provider]  lisinopril (PRINIVIL,ZESTRIL) 10 MG tablet Take 10 mg by mouth daily.   Yes [provider]  traMADol (ULTRAM) 50 MG tablet Take 1 tablet (50 mg total) by mouth every 6 (six) hours as needed. 02/12/17   Milton Ferguson, MD    Family History Family History  Problem Relation Age of Onset  . Colon cancer Neg Hx     Social History Social History   Tobacco Use  . Smoking status: Former Smoker    Packs/day: 0.25    Years: 6.00    Pack years: 1.50    Types: Cigarettes  . Smokeless tobacco: Former Systems developer    Quit date: 03/05/1983  Substance Use Topics  .  Alcohol use: Yes    Comment: drinks a few beers a day, states averages to 6 pack a week   . Drug use: No     Allergies   Patient has no known allergies.   Review of Systems Review of Systems  Constitutional: Negative for appetite change and fatigue.  HENT: Negative for congestion, ear discharge and sinus pressure.   Eyes: Negative for discharge.  Respiratory: Negative for cough.   Cardiovascular: Negative for chest pain.  Gastrointestinal: Negative for abdominal pain and diarrhea.  Genitourinary: Positive for flank pain. Negative for frequency and hematuria.  Musculoskeletal: Negative for back pain.  Skin:  Negative for rash.  Neurological: Negative for seizures and headaches.  Psychiatric/Behavioral: Negative for hallucinations.     Physical Exam Updated Vital Signs BP 120/72 (BP Location: Left Arm)   Pulse 87   Temp 98 F (36.7 C) (Oral)   Resp 17   Ht 5\' 11"  (1.803 m)   Wt 129.3 kg (285 lb)   SpO2 97%   BMI 39.75 kg/m   Physical Exam  Constitutional: He is oriented to person, place, and time. He appears well-developed.  HENT:  Head: Normocephalic.  Eyes: Conjunctivae and EOM are normal. No scleral icterus.  Neck: Neck supple. No thyromegaly present.  Cardiovascular: Normal rate and regular rhythm. Exam reveals no gallop and no friction rub.  No murmur heard. Pulmonary/Chest: No stridor. He has no wheezes. He has no rales. He exhibits no tenderness.  Abdominal: He exhibits no distension. There is no tenderness. There is no rebound.  Musculoskeletal: Normal range of motion. He exhibits no edema.  Tender left flank  Lymphadenopathy:    He has no cervical adenopathy.  Neurological: He is oriented to person, place, and time. He exhibits normal muscle tone. Coordination normal.  Skin: No rash noted. No erythema.  Psychiatric: He has a normal mood and affect. His behavior is normal.     ED Treatments / Results  Labs (all labs ordered are listed, but only abnormal results are displayed) Labs Reviewed  COMPREHENSIVE METABOLIC PANEL - Abnormal; Notable for the following components:      Result Value   Chloride 99 (*)    Glucose, Bld 101 (*)    BUN 21 (*)    Creatinine, Ser 1.29 (*)    AST 49 (*)    GFR calc non Af Amer 54 (*)    All other components within normal limits  URINALYSIS, ROUTINE W REFLEX MICROSCOPIC - Abnormal; Notable for the following components:   Color, Urine AMBER (*)    APPearance HAZY (*)    Protein, ur 30 (*)    Leukocytes, UA TRACE (*)    Bacteria, UA RARE (*)    Squamous Epithelial / LPF 0-5 (*)    All other components within normal limits  CBC  WITH DIFFERENTIAL/PLATELET  D-DIMER, QUANTITATIVE (NOT AT [facility])    EKG  EKG Interpretation None       Radiology Dg Chest 2 View  Result Date: 02/12/2017 CLINICAL DATA:  Shortness of breath and productive cough. EXAM: CHEST  2 VIEW COMPARISON:  None. FINDINGS: The heart size and mediastinal contours are within normal limits. Bibasilar scarring/atelectasis present. There is no evidence of pulmonary edema, consolidation, pneumothorax, nodule or pleural fluid. The visualized skeletal structures are unremarkable. IMPRESSION: No active cardiopulmonary disease. Electronically Signed   By: Aletta Edouard M.D.   On: 02/12/2017 16:49   Ct Renal Stone Study  Result Date: 02/12/2017 CLINICAL DATA:  C/o left  flank pain that started 2 weeks ago in right side but started in left side today. Per patient pain worse with movement. Denies any urinary symptoms. EXAM: CT ABDOMEN AND PELVIS WITHOUT CONTRAST TECHNIQUE: Multidetector CT imaging of the abdomen and pelvis was performed following the standard protocol without IV contrast. COMPARISON:  None. FINDINGS: Lower chest: No acute abnormality. Hepatobiliary: No focal liver abnormality is seen. No radiopaque gallstones, biliary dilatation, or pericholecystic inflammatory changes. Pancreas: Unremarkable. No pancreatic ductal dilatation or surrounding inflammatory changes. Spleen: Normal in size without focal abnormality. Adrenals/Urinary Tract: Adrenal glands are unremarkable. Kidneys are normal, without renal calculi, focal lesion, or hydronephrosis. Bladder is unremarkable. Stomach/Bowel: The stomach and small bowel loops are normal in appearance. The appendix is well seen and has a normal appearance. There is scattered colonic diverticula without acute diverticulitis. No bowel obstruction or abscess. There is moderate stool burden in the ascending and transverse colon. Vascular/Lymphatic: There is atherosclerotic calcification of the abdominal aorta not associated  with aneurysm. No retroperitoneal or mesenteric adenopathy. Reproductive: There is enlargement of the prostate gland. Seminal vesicles are normal in appearance. Other: There is no free pelvic fluid. No significant abdominal wall hernia. Musculoskeletal: No acute or significant osseous findings. IMPRESSION: 1. No urinary tract obstruction or stones. 2. Normal appendix. 3. Moderate stool burden possibly accounting for the patient's pain. 4.  Aortic atherosclerosis.  (ICD10-I70.0) 5. Prostatic enlargement. Electronically Signed   By: Nolon Nations M.D.   On: 02/12/2017 16:56    Procedures Procedures (including critical care time)  Medications Ordered in ED Medications  HYDROmorphone (DILAUDID) injection 1 mg (1 mg Intravenous Given 02/12/17 1701)  ondansetron (ZOFRAN) injection 4 mg (4 mg Intravenous Given 02/12/17 1701)  HYDROmorphone (DILAUDID) injection 0.5 mg (0.5 mg Intravenous Given 02/12/17 1852)     Initial Impression / Assessment and Plan / ED Course  I have reviewed the triage vital signs and the nursing notes.  Pertinent labs & imaging results that were available during my care of the patient were reviewed by me and considered in my medical decision making (see chart for details).     Labs including urinalysis and CT scan are unremarkable.  Suspect muscle skeletal pain.  Patient will be given Ultram will follow up with his PCP  Final Clinical Impressions(s) / ED Diagnoses   Final diagnoses:  Flank pain    ED Discharge Orders        Ordered    traMADol (ULTRAM) 50 MG tablet  Every 6 hours PRN     02/12/17 2002       Milton Ferguson, MD 02/12/17 2005

## 2017-02-12 NOTE — Discharge Instructions (Signed)
Follow-up with your doctor next week for recheck 

## 2017-02-12 NOTE — ED Triage Notes (Signed)
Patient c/o left flank pain that started 2 weeks ago in right side but started in left side today. Per patient pain worse with movement. Denies any urinary symptoms.

## 2018-09-08 ENCOUNTER — Other Ambulatory Visit: Payer: Self-pay

## 2018-09-08 DIAGNOSIS — Z20822 Contact with and (suspected) exposure to covid-19: Secondary | ICD-10-CM

## 2018-09-09 LAB — NOVEL CORONAVIRUS, NAA: SARS-CoV-2, NAA: NOT DETECTED

## 2018-11-06 ENCOUNTER — Emergency Department (HOSPITAL_COMMUNITY)
Admission: EM | Admit: 2018-11-06 | Discharge: 2018-11-07 | Disposition: A | Discharge status: Home / Self Care | Payer: Medicare HMO | Attending: Emergency Medicine | Admitting: Emergency Medicine

## 2018-11-06 ENCOUNTER — Other Ambulatory Visit: Payer: Self-pay

## 2018-11-06 ENCOUNTER — Emergency Department (HOSPITAL_COMMUNITY): Payer: Medicare HMO

## 2018-11-06 ENCOUNTER — Encounter (HOSPITAL_COMMUNITY): Payer: Self-pay | Admitting: Emergency Medicine

## 2018-11-06 DIAGNOSIS — Z20828 Contact with and (suspected) exposure to other viral communicable diseases: Secondary | ICD-10-CM | POA: Diagnosis not present

## 2018-11-06 DIAGNOSIS — U071 COVID-19: Secondary | ICD-10-CM | POA: Insufficient documentation

## 2018-11-06 DIAGNOSIS — I10 Essential (primary) hypertension: Secondary | ICD-10-CM | POA: Insufficient documentation

## 2018-11-06 DIAGNOSIS — Z79899 Other long term (current) drug therapy: Secondary | ICD-10-CM | POA: Insufficient documentation

## 2018-11-06 DIAGNOSIS — J029 Acute pharyngitis, unspecified: Secondary | ICD-10-CM | POA: Insufficient documentation

## 2018-11-06 DIAGNOSIS — Z87891 Personal history of nicotine dependence: Secondary | ICD-10-CM | POA: Insufficient documentation

## 2018-11-06 DIAGNOSIS — B349 Viral infection, unspecified: Secondary | ICD-10-CM

## 2018-11-06 DIAGNOSIS — R52 Pain, unspecified: Secondary | ICD-10-CM | POA: Diagnosis present

## 2018-11-06 LAB — BASIC METABOLIC PANEL
Anion gap: 9 (ref 5–15)
BUN: 22 mg/dL (ref 8–23)
CO2: 27 mmol/L (ref 22–32)
Calcium: 8.8 mg/dL — ABNORMAL LOW (ref 8.9–10.3)
Chloride: 98 mmol/L (ref 98–111)
Creatinine, Ser: 1.23 mg/dL (ref 0.61–1.24)
GFR calc Af Amer: >60 mL/min (ref >60)
GFR calc non Af Amer: 57 mL/min — ABNORMAL LOW (ref >60)
Glucose, Bld: 107 mg/dL — ABNORMAL HIGH (ref 70–99)
Potassium: 3.9 mmol/L (ref 3.5–5.1)
Sodium: 134 mmol/L — ABNORMAL LOW (ref 135–145)

## 2018-11-06 LAB — CBC
HCT: 41.5 % (ref 39.0–52.0)
Hemoglobin: 13.2 g/dL (ref 13.0–17.0)
MCH: 30.1 pg (ref 26.0–34.0)
MCHC: 31.8 g/dL (ref 30.0–36.0)
MCV: 94.5 fL (ref 80.0–100.0)
Platelets: 206 10*3/uL (ref 150–400)
RBC: 4.39 MIL/uL (ref 4.22–5.81)
RDW: 12.9 % (ref 11.5–15.5)
WBC: 3.5 10*3/uL — ABNORMAL LOW (ref 4.0–10.5)
nRBC: 0 % (ref 0.0–0.2)

## 2018-11-06 NOTE — ED Triage Notes (Signed)
Patient has had a sore throat with cough, congestion, chills and body aches x 3 days.

## 2018-11-07 LAB — GROUP A STREP BY PCR: Group A Strep by PCR: NOT DETECTED

## 2018-11-07 LAB — SARS CORONAVIRUS 2 (TAT 6-24 HRS): SARS Coronavirus 2: POSITIVE — AB

## 2018-11-07 MED ORDER — MAGIC MOUTHWASH W/LIDOCAINE: 5.0000 mL | Freq: Three times a day (TID) | ORAL | 0 refills | Status: DC | PRN

## 2018-11-07 NOTE — Discharge Instructions (Addendum)
Your COVID test is pending.  Your chest xray did not show any evidence of pneumonia.  You can review your results on Mychart.  Isolate yourself at home when possible and wear a face covering at least until your test results are back.  Tylenol every 4 hrs.  Follow-up with your doctor, return here if needed.

## 2018-11-07 NOTE — ED Provider Notes (Signed)
[facility] EMERGENCY DEPARTMENT Provider Note   CSN: [id number] Arrival date & time: 11/06/18  2032     History   Chief Complaint No chief complaint on file.   HPI Tyler Hopkins is a 73 y.o. male.     HPI   Tyler Hopkins is a 73 y.o. male who presents to the Emergency Department complaining of generalized body aches, sore throat, cough, nasal congestion, and chills.  Symptoms have been present for 3 days.  He describes pain to his throat associated with swallowing and body aches as his initial complaints.  States his cough has been minimal and nonproductive.  His spouse is also here with similar symptoms.  He has been using over-the-counter pain relievers and throat spray for symptom relief.  He denies any recent travel or known COVID exposures.  He denies fever at home, but has not checked his temperature.  He denies chest pain, shortness of breath, loss of taste or smell.   Past Medical History:  Diagnosis Date  . GERD (gastroesophageal reflux disease)   . Gout   . Hypertension   . PTSD (post-traumatic stress disorder)    Was in [place] 1966-1967, exposed to [employer]  . Shortness of breath   . Sleep apnea    pt has sleep apnea but "could not wear machine"    Patient Active Problem List   Diagnosis Date Noted  . Adenomatous polyp of colon 07/07/2012  . Dysphagia 03/01/2012  . Rectal bleeding 03/01/2012    Past Surgical History:  Procedure Laterality Date  . ABDOMINAL SURGERY     had chisel [place] in abdomen [place] [employer]  . COLONOSCOPY  March 2008   received op notes from [place], [place]. multiple colon polyps,  path: fragment of serrated adenoma, tublular adenomas  . COLONOSCOPY WITH PROPOFOL  03/09/2012   SLF:Two SMALL COLON POLYPS/RECTAL BLEEDING DUE TO Small internal hemorrhoids  . ESOPHAGOGASTRODUODENOSCOPY (EGD) WITH PROPOFOL  03/09/2012   [id number] ring was found at the gastroesophageal junction Small hiatal hernia/MILD Gastritis   .  KNEE ARTHROSCOPY     right knee  . POLYPECTOMY  03/09/2012   Procedure: POLYPECTOMY;  Surgeon: Danie Binder, MD;  Location: [facility];  Service: Endoscopy;  Laterality: [name];  . SAVORY DILATION  03/09/2012   Procedure: SAVORY DILATION;  Surgeon: Danie Binder, MD;  Location: [facility];  Service: Endoscopy;  Laterality: [name];  14/15/16/17        Home Medications    Prior to Admission medications   Medication Sig Start Date End Date Taking? Authorizing Provider  albuterol (PROVENTIL HFA;VENTOLIN HFA) 108 (90 Base) MCG/ACT inhaler Inhale 1-2 puffs into the lungs every 6 (six) hours as needed for wheezing or shortness of breath.    [provider]  amLODipine (NORVASC) 5 MG tablet Take 5 mg by mouth daily.    [provider]  budesonide-formoterol (SYMBICORT) 160-4.5 MCG/ACT inhaler Inhale 2 puffs into the lungs 2 (two) times daily.    [provider]  hydrochlorothiazide (HYDRODIURIL) 25 MG tablet Take 25 mg by mouth daily.    [provider]  lisinopril (PRINIVIL,ZESTRIL) 10 MG tablet Take 10 mg by mouth daily.    [provider]  magic mouthwash w/lidocaine SOLN Take 5 mLs by mouth 3 (three) times daily as needed for mouth pain. Swish and spit, do not swallow 11/07/18   Joncarlo Friberg, PA-C  traMADol (ULTRAM) 50 MG tablet Take 1 tablet (50 mg total) by mouth every  6 (six) hours as needed. 02/12/17   Milton Ferguson, MD    Family History Family History  Problem Relation Age of Onset  . Colon cancer Neg Hx     Social History Social History   Tobacco Use  . Smoking status: Former Smoker    Packs/day: 0.25    Years: 6.00    Pack years: 1.50    Types: Cigarettes  . Smokeless tobacco: Former Systems developer    Quit date: 03/05/1983  Substance Use Topics  . Alcohol use: Yes    Comment: drinks a few beers a day, states averages to 6 pack a week   . Drug use: No     Allergies   Patient has no known allergies.   Review of Systems Review of Systems   Constitutional: Positive for chills. Negative for activity change, appetite change and fever.  HENT: Positive for congestion and sore throat. Negative for facial swelling, rhinorrhea and trouble swallowing.   Eyes: Negative for visual disturbance.  Respiratory: Positive for cough. Negative for shortness of breath, wheezing and stridor.   Cardiovascular: Negative for chest pain.  Gastrointestinal: Negative for abdominal pain, diarrhea, nausea and vomiting.  Genitourinary: Negative for dysuria.  Musculoskeletal: Positive for myalgias. Negative for arthralgias, neck pain and neck stiffness.  Skin: Negative for rash.  Neurological: Negative for dizziness, weakness, numbness and headaches.  Hematological: Negative for adenopathy.  Psychiatric/Behavioral: Negative for confusion.     Physical Exam Updated Vital Signs BP 128/75 (BP Location: Left Arm)   Pulse 88   Temp 99.5 F (37.5 C) (Oral)   Resp 18   Ht 6' (1.829 m)   Wt 127.5 kg   SpO2 98%   BMI 38.11 kg/m   Physical Exam Vitals signs and nursing note reviewed.  Constitutional:      General: He is not in acute distress.    Appearance: Normal appearance. He is not ill-appearing.  HENT:     Head: Atraumatic.     Right Ear: Tympanic membrane and ear canal normal.     Left Ear: Tympanic membrane and ear canal normal.     Mouth/Throat:     Mouth: Mucous membranes are moist.     Pharynx: Posterior oropharyngeal erythema present. No oropharyngeal exudate.     Comments: Mild erythema of the oropharynx.  No edema or exudate.  Uvula is midline and nonedematous.  No peritonsillar abscess Neck:     Musculoskeletal: Normal range of motion.  Cardiovascular:     Rate and Rhythm: Normal rate and regular rhythm.     Pulses: Normal pulses.  Pulmonary:     Effort: Pulmonary effort is normal. No respiratory distress.     Breath sounds: Normal breath sounds. No wheezing.  Musculoskeletal: Normal range of motion.     Right lower leg: No  edema.     Left lower leg: No edema.  Lymphadenopathy:     Cervical: No cervical adenopathy.  Skin:    General: Skin is warm.     Capillary Refill: Capillary refill takes less than 2 seconds.     Findings: No rash.  Neurological:     General: No focal deficit present.     Mental Status: He is alert.      ED Treatments / Results  Labs (all labs ordered are listed, but only abnormal results are displayed) Labs Reviewed  CBC - Abnormal; Notable for the following components:      Result Value   WBC 3.5 (*)    All other  components within normal limits  BASIC METABOLIC PANEL - Abnormal; Notable for the following components:   Sodium 134 (*)    Glucose, Bld 107 (*)    Calcium 8.8 (*)    GFR calc non Af Amer 57 (*)    All other components within normal limits  GROUP A STREP BY PCR  SARS CORONAVIRUS 2 (TAT 6-24 HRS)    EKG None  Radiology Dg Chest Portable 1 View  Result Date: 11/07/2018 CLINICAL DATA:  Cough chills and sore throat EXAM: PORTABLE CHEST 1 VIEW COMPARISON:  02/12/2017 FINDINGS: The heart size and mediastinal contours are within normal limits. Both lungs are clear. The visualized skeletal structures are unremarkable. IMPRESSION: No active disease. Electronically Signed   By: Donavan Foil M.D.   On: 11/07/2018 00:24    Procedures Procedures (including critical care time)  Medications Ordered in ED Medications - No data to display   Initial Impression / Assessment and Plan / ED Course  I have reviewed the triage vital signs and the nursing notes.  Pertinent labs & imaging results that were available during my care of the patient were reviewed by me and considered in my medical decision making (see chart for details).        Patient with 3-day history of flulike symptoms.  Clinical suspicion for COVID is high.  Patient spouse is also here for evaluation with similar symptoms.  COVID test is pending, patient advised to isolate at home until his test results  are back.  He appears appropriate for discharge home.  No clinical concerns for sepsis at this time.  He agrees to COVID precautions as discussed.  Final Clinical Impressions(s) / ED Diagnoses   Final diagnoses:  Pharyngitis, unspecified etiology  Viral illness    ED Discharge Orders         Ordered    magic mouthwash w/lidocaine SOLN  3 times daily PRN     11/07/18 0028           Kem Parkinson, PA-C 11/07/18 2227    Daleen Bo, MD 11/09/18 [phone]

## 2018-11-08 ENCOUNTER — Telehealth: Payer: Self-pay | Admitting: *Deleted

## 2018-11-08 NOTE — Telephone Encounter (Signed)
Pt given result of COVID test; he is having sore throat and cough; the pt was seen in the ED 11/06/2018; instructions given to pt: . remain in self-quarantine until they meet the "Non-Test Criteria for Ending Self-Isolation". Non-Test Criteria for Ending Self-Isolation All persons with fever and respiratory symptoms should isolate themselves until ALL conditions listed below are met: - at least 10 days since symptoms onset - AND 3 consecutive days fever free without antipyretics (acetaminophen [Tylenol] or ibuprofen [Advil]) - AND improvement in respiratory symptoms . If the patient develops respiratory issues/distress, seek medical care in the Emergency Department, call 911, reports symptoms and report COVID-19 positive test. Patient Instructions . continue to utilize over-the-counter medications for fever (ibuprofen and/or Tylenol) and cough (cough medicine and/or sore throat lozenges). .  wear a mask around people and follow good infection prevention techniques. Marland Kitchen only leave home to seek medical care. . send family for food, prescriptions or medicines; or use delivery service.  . If the patient must leave the home, they must wear a mask in public. Marland Kitchen limit contact with immediate family members or caregivers in the home, and use mask, social distancing, and handwashing to decrease risk to patients. o Please continue good preventive care measures, including frequent hand washing, avoid touching your face, cover coughs/sneezes with tissue or into elbow, stay out of crowds and keep a 6-foot distance from others.   . patient and family to clean hard surfaces touched by patient frequently with household cleaning products; pt advised to contact his PCP; pt also informed that [facility] notified, and may contact him with further recommendations; he verbalized understanding; not able to chart in result note because no encounter created.

## 2018-11-16 ENCOUNTER — Other Ambulatory Visit: Payer: Self-pay | Admitting: *Deleted

## 2018-11-16 DIAGNOSIS — Z20822 Contact with and (suspected) exposure to covid-19: Secondary | ICD-10-CM

## 2018-11-17 LAB — NOVEL CORONAVIRUS, NAA: SARS-CoV-2, NAA: DETECTED — AB

## 2018-11-24 ENCOUNTER — Other Ambulatory Visit: Payer: Self-pay | Admitting: *Deleted

## 2018-11-24 DIAGNOSIS — Z20822 Contact with and (suspected) exposure to covid-19: Secondary | ICD-10-CM

## 2018-11-25 LAB — NOVEL CORONAVIRUS, NAA: SARS-CoV-2, NAA: NOT DETECTED

## 2019-02-17 IMAGING — DX DG CHEST 2V
2 series · 2 of 2 positions shown · non-contrast
Comparison: None.

CLINICAL DATA: Shortness of breath and productive cough.

EXAM:
CHEST  2 VIEW

[chest pa]
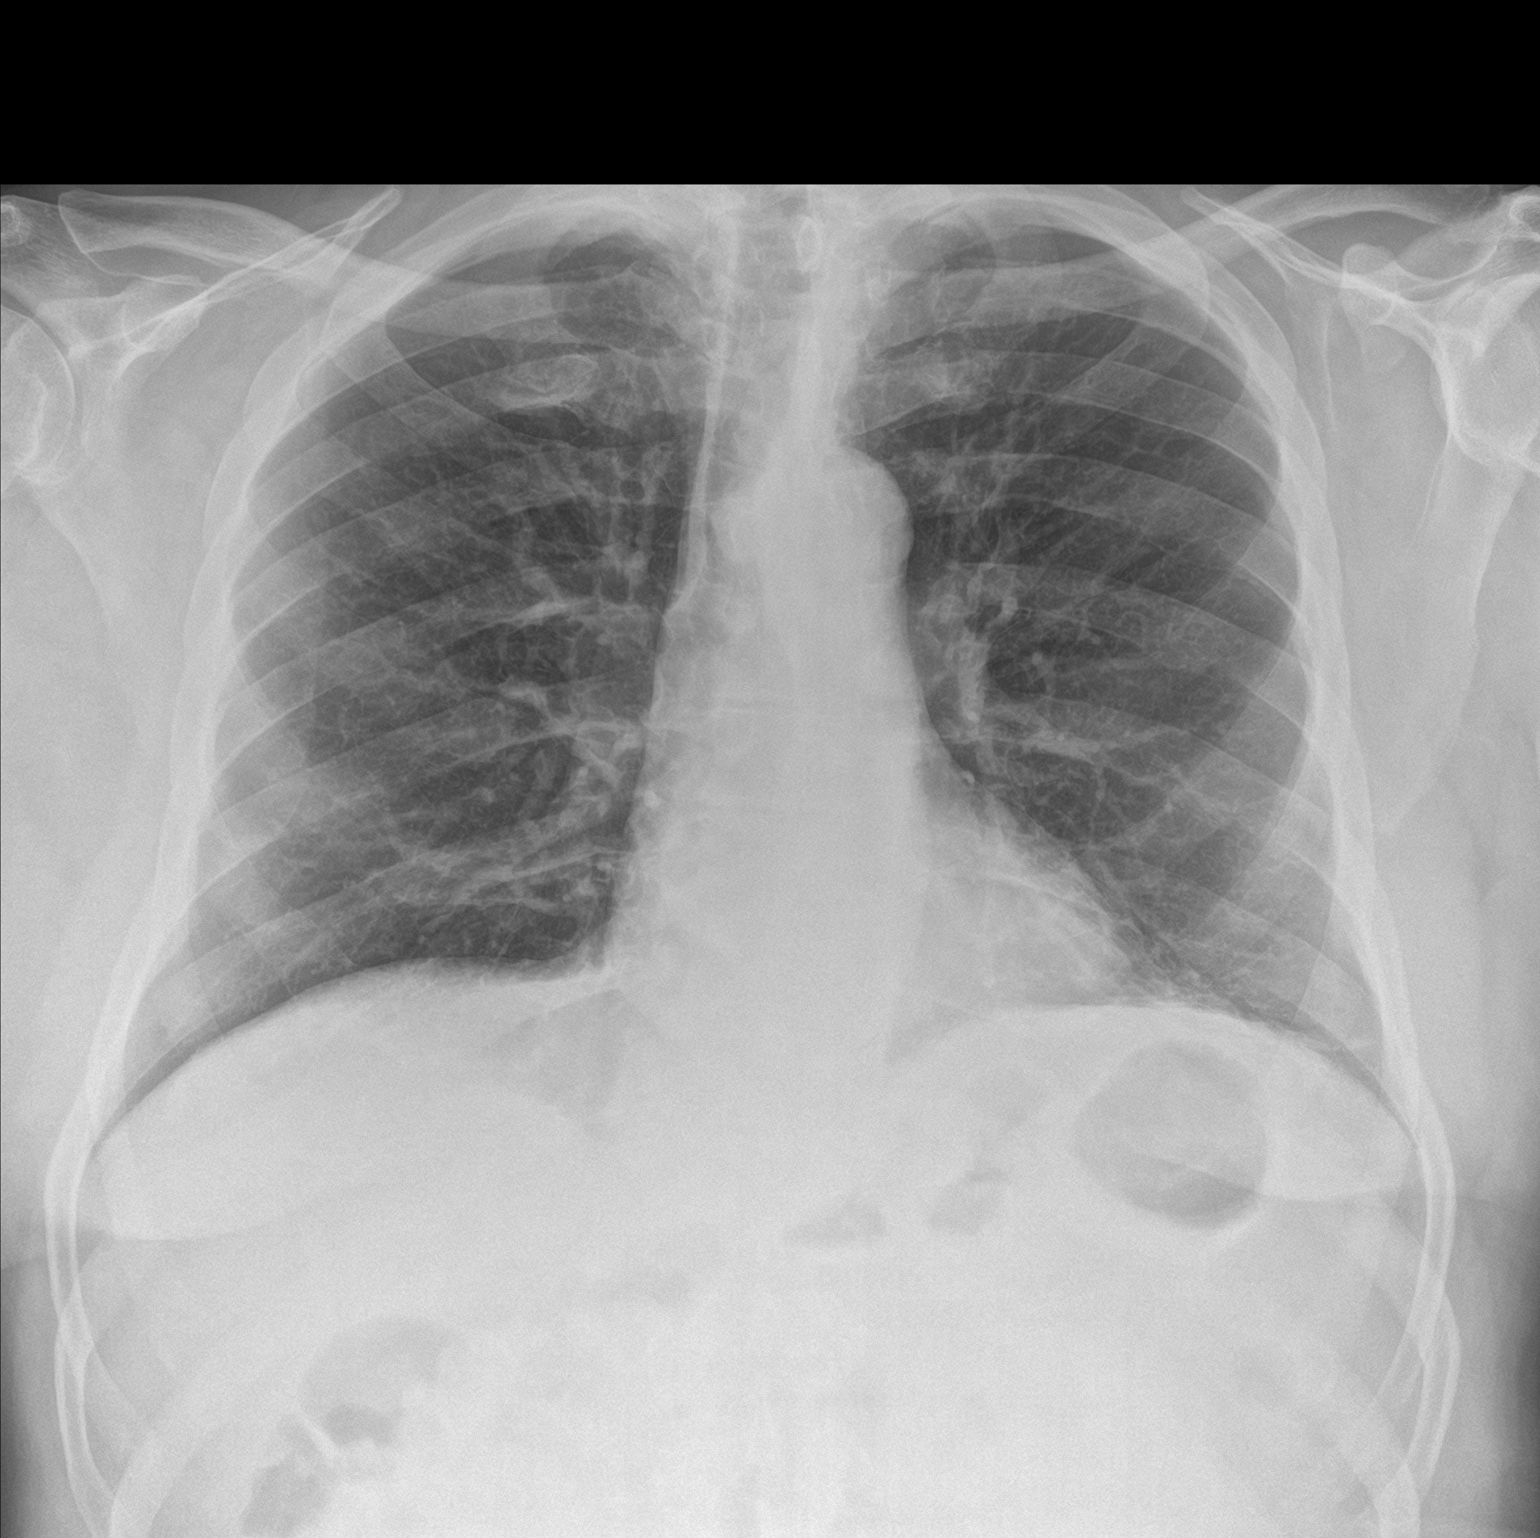

[chest lat]
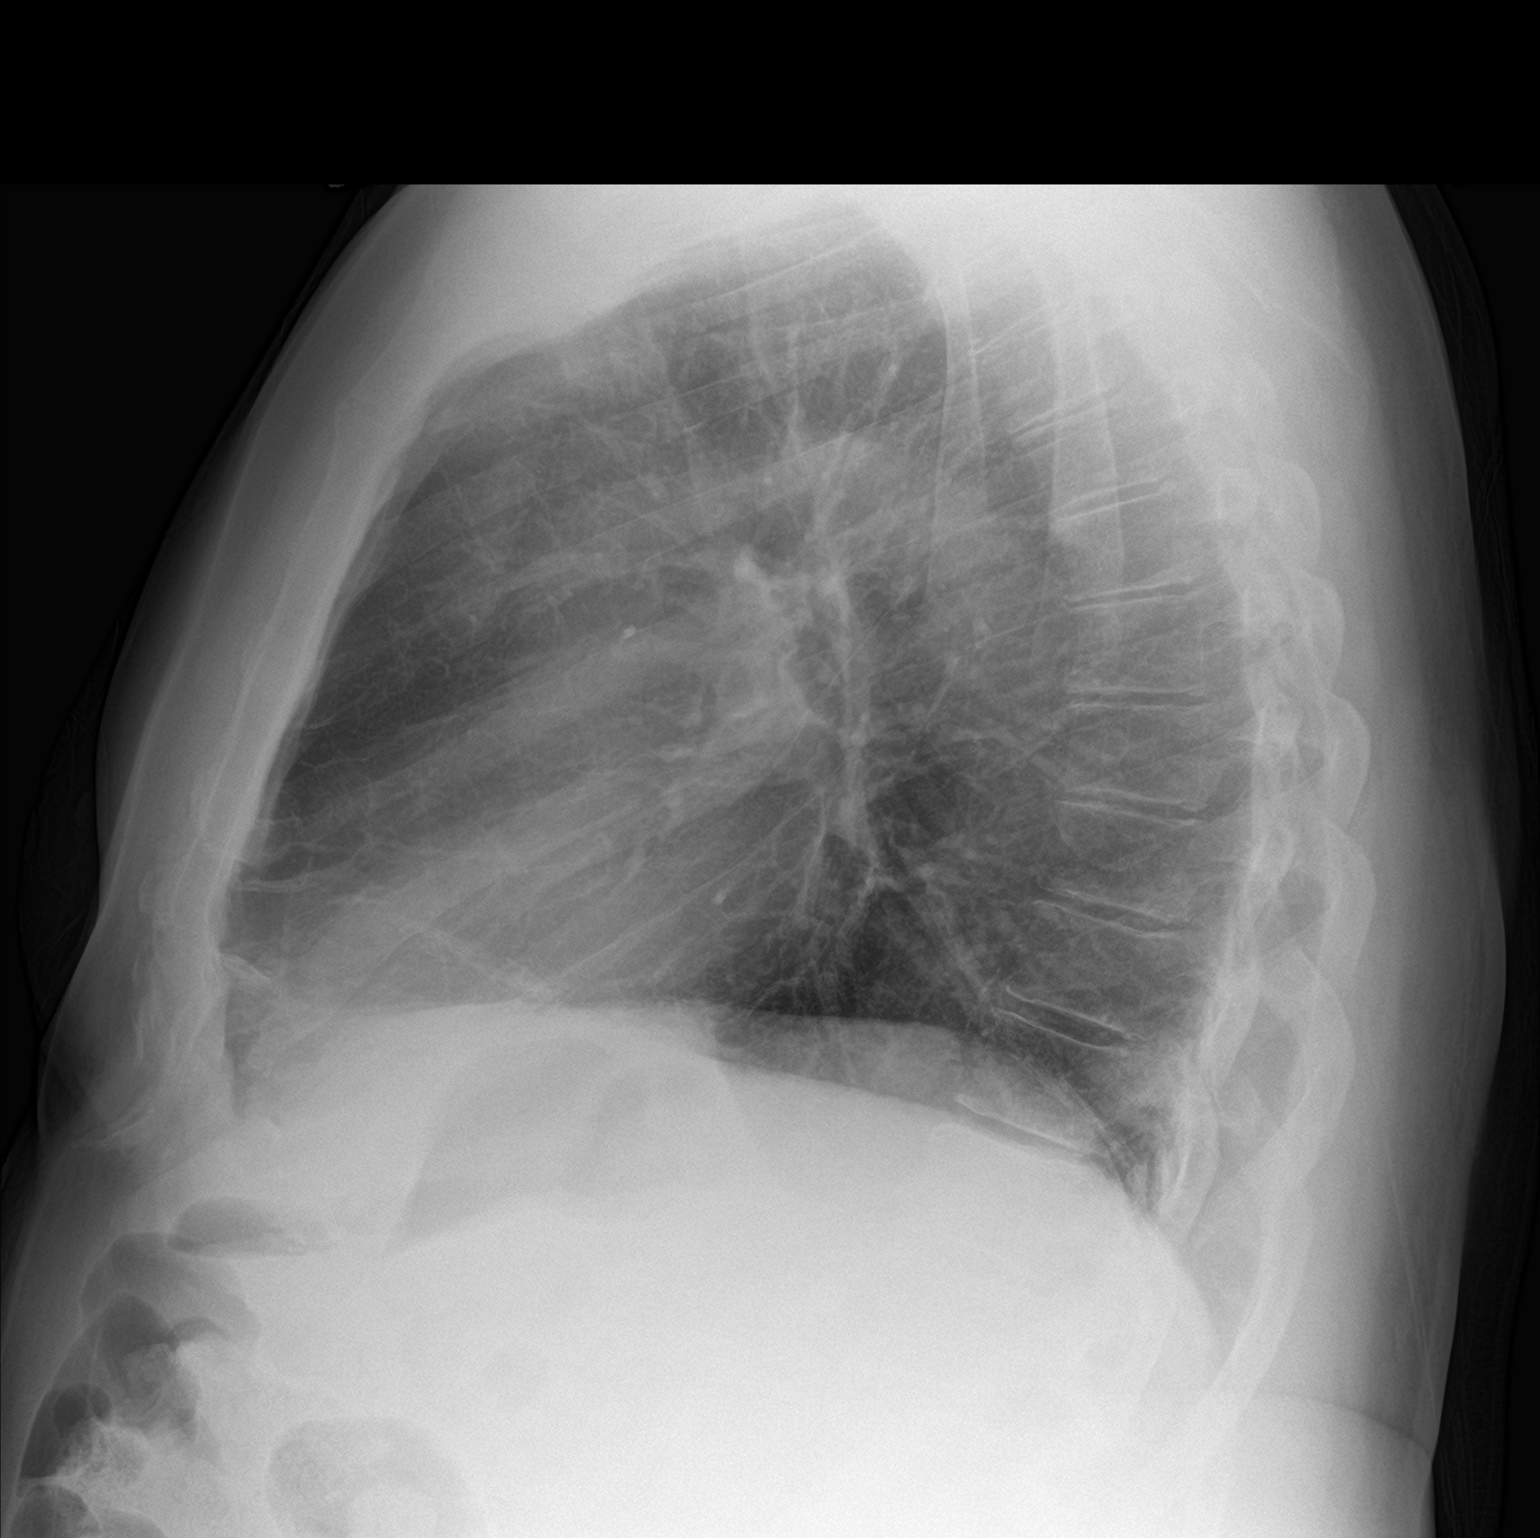

[2 of 2 positions shown; findings below may reference images not displayed]

FINDINGS: The heart size and mediastinal contours are within normal limits.
Bibasilar scarring/atelectasis present. There is no evidence of
pulmonary edema, consolidation, pneumothorax, nodule or pleural
fluid. The visualized skeletal structures are unremarkable.
IMPRESSION: No active cardiopulmonary disease.

## 2019-04-05 ENCOUNTER — Other Ambulatory Visit: Payer: Self-pay

## 2019-04-05 ENCOUNTER — Ambulatory Visit: Payer: Medicare HMO | Attending: Internal Medicine

## 2019-04-05 DIAGNOSIS — Z20822 Contact with and (suspected) exposure to covid-19: Secondary | ICD-10-CM

## 2019-04-06 LAB — NOVEL CORONAVIRUS, NAA: SARS-CoV-2, NAA: NOT DETECTED

## 2019-09-22 ENCOUNTER — Emergency Department (HOSPITAL_COMMUNITY)
Admission: EM | Admit: 2019-09-22 | Discharge: 2019-09-23 | Disposition: A | Discharge status: Home / Self Care | Payer: Medicare HMO | Attending: Emergency Medicine | Admitting: Emergency Medicine

## 2019-09-22 ENCOUNTER — Emergency Department (HOSPITAL_COMMUNITY): Payer: Medicare HMO

## 2019-09-22 ENCOUNTER — Other Ambulatory Visit: Payer: Self-pay

## 2019-09-22 ENCOUNTER — Encounter (HOSPITAL_COMMUNITY): Payer: Self-pay | Admitting: Emergency Medicine

## 2019-09-22 DIAGNOSIS — Z79899 Other long term (current) drug therapy: Secondary | ICD-10-CM | POA: Diagnosis not present

## 2019-09-22 DIAGNOSIS — Z87891 Personal history of nicotine dependence: Secondary | ICD-10-CM | POA: Diagnosis not present

## 2019-09-22 DIAGNOSIS — K219 Gastro-esophageal reflux disease without esophagitis: Secondary | ICD-10-CM | POA: Diagnosis not present

## 2019-09-22 DIAGNOSIS — R109 Unspecified abdominal pain: Secondary | ICD-10-CM | POA: Diagnosis not present

## 2019-09-22 DIAGNOSIS — I10 Essential (primary) hypertension: Secondary | ICD-10-CM | POA: Diagnosis not present

## 2019-09-22 LAB — BASIC METABOLIC PANEL
Anion gap: 10 (ref 5–15)
BUN: 19 mg/dL (ref 8–23)
CO2: 28 mmol/L (ref 22–32)
Calcium: 9.6 mg/dL (ref 8.9–10.3)
Chloride: 97 mmol/L — ABNORMAL LOW (ref 98–111)
Creatinine, Ser: 1.16 mg/dL (ref 0.61–1.24)
GFR calc Af Amer: >60 mL/min (ref >60)
GFR calc non Af Amer: >60 mL/min (ref >60)
Glucose, Bld: 102 mg/dL — ABNORMAL HIGH (ref 70–99)
Potassium: 4.2 mmol/L (ref 3.5–5.1)
Sodium: 135 mmol/L (ref 135–145)

## 2019-09-22 LAB — URINALYSIS, ROUTINE W REFLEX MICROSCOPIC
Bilirubin Urine: NEGATIVE
Glucose, UA: NEGATIVE mg/dL
Hgb urine dipstick: NEGATIVE
Ketones, ur: NEGATIVE mg/dL
Leukocytes,Ua: NEGATIVE
Nitrite: NEGATIVE
Protein, ur: NEGATIVE mg/dL
Specific Gravity, Urine: 1.019 (ref 1.005–1.030)
pH: 5 (ref 5.0–8.0)

## 2019-09-22 LAB — CBC
HCT: 46.3 % (ref 39.0–52.0)
Hemoglobin: 15.1 g/dL (ref 13.0–17.0)
MCH: 30.6 pg (ref 26.0–34.0)
MCHC: 32.6 g/dL (ref 30.0–36.0)
MCV: 93.7 fL (ref 80.0–100.0)
Platelets: 249 10*3/uL (ref 150–400)
RBC: 4.94 MIL/uL (ref 4.22–5.81)
RDW: 12.6 % (ref 11.5–15.5)
WBC: 7.3 10*3/uL (ref 4.0–10.5)
nRBC: 0 % (ref 0.0–0.2)

## 2019-09-22 LAB — D-DIMER, QUANTITATIVE: D-Dimer, Quant: 0.39 ug/mL-FEU (ref 0.00–0.50)

## 2019-09-22 MED ORDER — LIDOCAINE 5 % EX PTCH: 1.0000 | MEDICATED_PATCH | CUTANEOUS | 0 refills | Status: DC

## 2019-09-22 MED ORDER — MORPHINE SULFATE (PF) 4 MG/ML IV SOLN
4.0000 mg | Freq: Once | INTRAVENOUS | Status: AC
  Administered 2019-09-22: 4 mg via INTRAVENOUS
  Filled 2019-09-22: qty 1

## 2019-09-22 NOTE — ED Provider Notes (Signed)
[facility] EMERGENCY DEPARTMENT Provider Note   CSN: [id number] Arrival date & time: 09/22/19  1444     History Chief Complaint  Patient presents with  . Flank Pain    Tyler Hopkins is a 74 y.o. male.  HPI   Pt has been having pain in his right side for several months.  Last night however the pain became more severe.  The pain increases with movement.  It also increases with breathing. No cough but he does feel short of breath.  No pain with urination.  The pain does go down into the right leg sometimes.    Past Medical History:  Diagnosis Date  . GERD (gastroesophageal reflux disease)   . Gout   . Hypertension   . PTSD (post-traumatic stress disorder)    Was in [place] 1966-1967, exposed to [employer]  . Shortness of breath   . Sleep apnea    pt has sleep apnea but "could not wear machine"    Patient Active Problem List   Diagnosis Date Noted  . Adenomatous polyp of colon 07/07/2012  . Dysphagia 03/01/2012  . Rectal bleeding 03/01/2012    Past Surgical History:  Procedure Laterality Date  . ABDOMINAL SURGERY     had chisel embedded in abdomen [place]  . COLONOSCOPY  March 2008   received op notes from [place], [place]. multiple colon polyps,  path: fragment of serrated adenoma, tublular adenomas  . COLONOSCOPY WITH PROPOFOL  03/09/2012   SLF:Two SMALL COLON POLYPS/RECTAL BLEEDING DUE TO Small internal hemorrhoids  . ESOPHAGOGASTRODUODENOSCOPY (EGD) WITH PROPOFOL  03/09/2012   [id number] ring was found at the gastroesophageal junction Small hiatal hernia/MILD Gastritis   . KNEE ARTHROSCOPY     right knee  . POLYPECTOMY  03/09/2012   Procedure: POLYPECTOMY;  Surgeon: Danie Binder, MD;  Location: [facility];  Service: Endoscopy;  Laterality: [name];  . SAVORY DILATION  03/09/2012   Procedure: SAVORY DILATION;  Surgeon: Danie Binder, MD;  Location: [facility];  Service: Endoscopy;  Laterality: [name];  14/15/16/17       Family History  Problem Relation  Age of Onset  . Colon cancer Neg Hx     Social History   Tobacco Use  . Smoking status: Former Smoker    Packs/day: 0.25    Years: 6.00    Pack years: 1.50    Types: Cigarettes  . Smokeless tobacco: Former Systems developer    Quit date: 03/05/1983  Vaping Use  . Vaping Use: Never used  Substance Use Topics  . Alcohol use: Yes    Comment: drinks a few beers a day, states averages to 6 pack a week   . Drug use: No    Home Medications Prior to Admission medications   Medication Sig Start Date End Date Taking? Authorizing Provider  albuterol (PROVENTIL HFA;VENTOLIN HFA) 108 (90 Base) MCG/ACT inhaler Inhale 1-2 puffs into the lungs every 6 (six) hours as needed for wheezing or shortness of breath.    [provider]  amLODipine (NORVASC) 5 MG tablet Take 5 mg by mouth daily.    [provider]  budesonide-formoterol (SYMBICORT) 160-4.5 MCG/ACT inhaler Inhale 2 puffs into the lungs 2 (two) times daily.    [provider]  hydrochlorothiazide (HYDRODIURIL) 25 MG tablet Take 25 mg by mouth daily.    [provider]  lidocaine (LIDODERM) 5 % Place 1 patch onto the skin daily. Remove & Discard patch within 12 hours or as directed  by MD 09/22/19   Dorie Rank, MD  lisinopril (PRINIVIL,ZESTRIL) 10 MG tablet Take 10 mg by mouth daily.    [provider]  magic mouthwash w/lidocaine SOLN Take 5 mLs by mouth 3 (three) times daily as needed for mouth pain. Swish and spit, do not swallow 11/07/18   Triplett, Tammy, PA-C  traMADol (ULTRAM) 50 MG tablet Take 1 tablet (50 mg total) by mouth every 6 (six) hours as needed. 02/12/17   Milton Ferguson, MD    Allergies    Patient has no known allergies.  Review of Systems   Review of Systems  All other systems reviewed and are negative.   Physical Exam Updated Vital Signs BP (!) 144/70   Pulse 96   Temp 98.9 F (37.2 C) (Oral)   Resp 16   Ht 1.829 m (6')   Wt 129.3 kg   SpO2 97%   BMI 38.65 kg/m   Physical  Exam Vitals and nursing note reviewed.  Constitutional:      General: He is not in acute distress.    Appearance: He is well-developed.  HENT:     Head: Normocephalic and atraumatic.     Right Ear: External ear normal.     Left Ear: External ear normal.  Eyes:     General: No scleral icterus.       Right eye: No discharge.        Left eye: No discharge.     Conjunctiva/sclera: Conjunctivae normal.  Neck:     Trachea: No tracheal deviation.  Cardiovascular:     Rate and Rhythm: Normal rate and regular rhythm.  Pulmonary:     Effort: Pulmonary effort is normal. No respiratory distress.     Breath sounds: Normal breath sounds. No stridor. No wheezing or rales.  Abdominal:     General: Bowel sounds are normal. There is no distension.     Palpations: Abdomen is soft.     Tenderness: There is no abdominal tenderness. There is right CVA tenderness. There is no guarding or rebound.  Musculoskeletal:        General: No tenderness.     Cervical back: Neck supple.  Skin:    General: Skin is warm and dry.     Findings: No rash.  Neurological:     Mental Status: He is alert.     Cranial Nerves: No cranial nerve deficit (no facial droop, extraocular movements intact, no slurred speech).     Sensory: No sensory deficit.     Motor: No abnormal muscle tone or seizure activity.     Coordination: Coordination normal.     ED Results / Procedures / Treatments   Labs (all labs ordered are listed, but only abnormal results are displayed) Labs Reviewed  URINALYSIS, ROUTINE W REFLEX MICROSCOPIC - Abnormal; Notable for the following components:      Result Value   APPearance HAZY (*)    All other components within normal limits  BASIC METABOLIC PANEL - Abnormal; Notable for the following components:   Chloride 97 (*)    Glucose, Bld 102 (*)    All other components within normal limits  URINE CULTURE  CBC  D-DIMER, QUANTITATIVE (NOT AT [facility])    EKG None  Radiology DG Chest 2  View  Result Date: 09/22/2019 CLINICAL DATA:  Flank pain EXAM: CHEST - 2 VIEW COMPARISON:  11/07/2018 FINDINGS: The heart size and mediastinal contours are within normal limits. Both lungs are clear. The visualized skeletal structures are unremarkable.  IMPRESSION: No active cardiopulmonary disease. Electronically Signed   By: Donavan Foil M.D.   On: 09/22/2019 23:40   DG Lumbar Spine Complete  Result Date: 09/22/2019 CLINICAL DATA:  Back pain EXAM: LUMBAR SPINE - COMPLETE 4+ VIEW COMPARISON:  CT 02/12/2017 FINDINGS: Trace anterolisthesis L4 on L5. Vertebral body heights are maintained. The disc spaces are within normal limits. Aortic atherosclerosis. Mild facet degenerative changes of the lower lumbar spine. IMPRESSION: Trace anterolisthesis L4 on L5.  No acute osseous abnormality. Electronically Signed   By: Donavan Foil M.D.   On: 09/22/2019 23:41    Procedures Procedures (including critical care time)  Medications Ordered in ED Medications  morphine 4 MG/ML injection 4 mg (4 mg Intravenous Given 09/22/19 2254)    ED Course  I have reviewed the triage vital signs and the nursing notes.  Pertinent labs & imaging results that were available during my care of the patient were reviewed by me and considered in my medical decision making (see chart for details).    MDM Rules/Calculators/A&P                          Patient presents to the ED for evaluation of flank pain.  Symptoms have been ongoing for several months.  On exam patient did not have any abdominal pain.  He was breathing easily.  Lungs were clear.  ED work-up is reassuring.  Chest x-ray without acute abnormalities.  Lumbar spine without acute findings.  CBC metabolic panel and D-dimer all negative.  Symptoms are most likely musculoskeletal.  Will discharge home with lidocaine patch.  Recommend outpatient follow-up with PCP Final Clinical Impression(s) / ED Diagnoses Final diagnoses:  Flank pain    Rx / DC Orders ED  Discharge Orders         Ordered    lidocaine (LIDODERM) 5 %  Every 24 hours        09/22/19 2359           Dorie Rank, MD 09/23/19 0000

## 2019-09-22 NOTE — Discharge Instructions (Addendum)
Take the medications as needed for pain.  Follow-up with your doctors at the [facility] for further evaluation

## 2019-09-22 NOTE — ED Triage Notes (Signed)
Pt c/o right flank pain for the last few days.

## 2019-09-24 LAB — URINE CULTURE: Special Requests: NORMAL

## 2019-11-15 ENCOUNTER — Encounter: Payer: Self-pay | Admitting: Orthopaedic Surgery

## 2019-11-15 ENCOUNTER — Other Ambulatory Visit: Payer: Self-pay

## 2019-11-15 ENCOUNTER — Ambulatory Visit: Payer: Medicare HMO | Admitting: Orthopaedic Surgery

## 2019-11-15 VITALS — BP 169/87 | HR 90 | Ht 71.0 in | Wt 285.0 lb

## 2019-11-15 DIAGNOSIS — S76301A Unspecified injury of muscle, fascia and tendon of the posterior muscle group at thigh level, right thigh, initial encounter: Secondary | ICD-10-CM | POA: Diagnosis not present

## 2019-11-15 NOTE — Patient Instructions (Addendum)
Hamstring Strain It takes 3-4 weeks for the hamstring to heal / continue to use ice or use heat 20-30 minutes at a time on your leg/ be careful not to burn yourself. Use Aspercreme, Biofreeze or Voltaren gel over the counter 2-3 times daily make sure you rub it in well each time you use it. Use the gel AFTER you use the heat or ice using a cane in the opposite hand from the injury will often help the pain also.  Stay on the Naproxen from the [place]. Use an Aspirin 81mg  daily also . Stretch the hamstring area gently at bedtime   Sit with your left / right heel propped on a chair, a coffee table, or a footstool. Do not have anything under your knee to support it.Allow your leg muscles to relax, letting gravity straighten out your knee (extension). You should feel a stretch behind your  right knee.  Hold this position for __10-20_ seconds.   A hamstring strain happens when the muscles in the back of the thighs (hamstring muscles) are overstretched or torn. The hamstring muscles are used in straightening the hips, bending the knees, and pulling back the legs. This injury is often called a pulled hamstring muscle. The tissue that connects the muscle to a bone (tendon) may also be affected. The severity of a hamstring strain may be rated in degrees or grades. First-degree (or grade 1) strains have the least amount of muscle tearing and pain. Second-degree and third-degree (grade 2 and 3) strains have increasingly more tearing and pain. What are the causes? This condition is caused by a sudden, violent force being placed on the hamstring muscles, stretching them too far. This often happens during activities that involve running, jumping, kicking, or weight lifting. What increases the risk? Hamstring strains are especially common in athletes. The following factors may also make you more likely to develop this condition:  Having low strength, endurance, or flexibility of the hamstring muscles.  Doing  high-impact physical activity or sports.  Having poor physical fitness.  Having a previous leg injury.  Having tired (fatigued) muscles. What are the signs or symptoms? Symptoms of this condition include:  Pain in the back of the thigh.  Swelling.  Bruising.  Muscle spasms.  Trouble moving the affected muscle because of pain. For severe strains, you may feel popping or snapping in the back of your thigh when the injury occurs. How is this diagnosed? This condition is diagnosed based on your symptoms, your medical history, and a physical exam. How is this treated? Treatment for this condition usually involves:  Protecting, resting, icing, applying compression, and elevating the injured area (PRICE therapy).  Medicines. Your health care provider may recommend medicines to help reduce pain or inflammation.  Doing exercises to regain strength and flexibility in the muscles. Your health care provider will tell you when it is okay to begin exercising. Follow these instructions at home: PRICE therapy Use PRICE therapy to promote muscle healing during the first 2-3 days after your injury, or as told by your health care provider.  Protect the muscle from being injured again.  Rest your injury. This usually involves limiting your normal activities and not using the injured hamstring muscle. Talk with your health care provider about how you should limit your activities.  Apply ice to the injured area: ? Put ice in a plastic bag. ? Place a towel between your skin and the bag. ? Leave the ice on for 20 minutes, 2-3 times a  day. After the third day, switch to applying heat as told.  Put pressure (compression) on your injured hamstring by wrapping it with an elastic bandage. Be careful not to wrap it too tightly. That may interfere with blood circulation or may increase swelling.  Raise (elevate) your injured hamstring above the level of your heart as often as possible. When you are  lying down, you can do this by putting a pillow under your thigh.  Activity  Begin exercising or stretching only as told by your health care provider.  Do not return to full activity level until your health care provider approves.  To help prevent muscle strains in the future, always warm up before exercising and stretch afterward. General instructions  Take over-the-counter and prescription medicines only as told by your health care provider.  If directed, apply heat to the affected area as often as told by your health care provider. Use the heat source that your health care provider recommends, such as a moist heat pack or a heating pad. ? Place a towel between your skin and the heat source. ? Leave the heat on for 20-30 minutes. ? Remove the heat if your skin turns bright red. This is especially important if you are unable to feel pain, heat, or cold. You may have a greater risk of getting burned.  Keep all follow-up visits as told by your health care provider. This is important. Contact a health care provider if you have:  Increasing pain or swelling in the injured area.  Numbness, tingling, or a significant loss of strength in the injured area. Get help right away if:  Your foot or your toes become cold or turn blue. Summary  A hamstring strain happens when the muscles in the back of the thighs (hamstring muscles) are overstretched or torn.  This injury can be caused by a sudden, violent force being placed on the hamstring muscles, causing them to stretch too far.  Symptoms include pain, swelling, and muscle spasms in the injured area.  Treatment includes what is called PRICE therapy: protecting, resting, icing, applying compression, and elevating the injured area. This information is not intended to replace advice given to you by your health care provider. Make sure you discuss any questions you have with your health care provider. Document Revised: 01/02/2017 Document  Reviewed: 12/18/2016 Elsevier Patient Education  2020 [employer].

## 2019-11-15 NOTE — Progress Notes (Signed)
Subjective:    Patient ID: Tyler Hopkins, male    DOB: 25-Mar-1945, 74 y.o.   MRN: [id number]  HPI He fell out of a boat about a week ago today and hurt his right hamstring area.  He has had swelling and pain of it and tightness with slight spasm at night.  He has had pain medicine from the [place].  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  He is slowly getting better but wanted to be seen.  He has felt no defect.     Review of Systems  Constitutional: Positive for activity change.  Musculoskeletal: Positive for arthralgias, gait problem and myalgias.  All other systems reviewed and are negative.  For Review of Systems, all other systems reviewed and are negative.  The following is a summary of the past history medically, past history surgically, known current medicines, social history and family history.  This information is gathered electronically by the computer from prior information and documentation.  I review this each visit and have found including this information at this point in the chart is beneficial and informative.   Past Medical History:  Diagnosis Date  . GERD (gastroesophageal reflux disease)   . Gout   . Hypertension   . PTSD (post-traumatic stress disorder)    Was in [place] 1966-1967, exposed to [employer]  . Shortness of breath   . Sleep apnea    pt has sleep apnea but "could not wear machine"    Past Surgical History:  Procedure Laterality Date  . ABDOMINAL SURGERY     had chisel embedded in abdomen while working [place] the [place]  . COLONOSCOPY  March 2008   received op notes from [place], [place]. multiple colon polyps,  path: fragment of serrated adenoma, tublular adenomas  . COLONOSCOPY WITH PROPOFOL  03/09/2012   SLF:Two SMALL COLON POLYPS/RECTAL BLEEDING DUE TO Small internal hemorrhoids  . ESOPHAGOGASTRODUODENOSCOPY (EGD) WITH PROPOFOL  03/09/2012   PYP:[id number] ring was found at the gastroesophageal junction Small hiatal hernia/MILD Gastritis   .  KNEE ARTHROSCOPY     right knee  . POLYPECTOMY  03/09/2012   Procedure: POLYPECTOMY;  Surgeon: Danie Binder, MD;  Location: [facility];  Service: Endoscopy;  Laterality: [name];  . SAVORY DILATION  03/09/2012   Procedure: SAVORY DILATION;  Surgeon: Danie Binder, MD;  Location: [facility];  Service: Endoscopy;  Laterality: [name];  14/15/16/17    Current Outpatient Medications on File Prior to Visit  Medication Sig Dispense Refill  . amLODipine (NORVASC) 5 MG tablet Take 5 mg by mouth daily.    . chlorthalidone (HYGROTON) 25 MG tablet Take 25 mg by mouth daily.    Marland Kitchen lisinopril (PRINIVIL,ZESTRIL) 10 MG tablet Take 10 mg by mouth daily.    . naproxen (NAPROSYN) 500 MG tablet Take 500 mg by mouth 2 (two) times daily with a meal.    . albuterol (PROVENTIL HFA;VENTOLIN HFA) 108 (90 Base) MCG/ACT inhaler Inhale 1-2 puffs into the lungs every 6 (six) hours as needed for wheezing or shortness of breath. (Patient not taking: Reported on 11/15/2019)    . budesonide-formoterol (SYMBICORT) 160-4.5 MCG/ACT inhaler Inhale 2 puffs into the lungs 2 (two) times daily. (Patient not taking: Reported on 11/15/2019)    . hydrochlorothiazide (HYDRODIURIL) 25 MG tablet Take 25 mg by mouth daily. (Patient not taking: Reported on 11/15/2019)    . lidocaine (LIDODERM) 5 % Place 1 patch onto the skin daily. Remove & Discard patch within 12  hours or as directed by MD (Patient not taking: Reported on 11/15/2019) 10 patch 0   No current facility-administered medications on file prior to visit.    Social History   Socioeconomic History  . Marital status: Married    Spouse name: Not on file  . Number of children: Not on file  . Years of education: Not on file  . Highest education level: Not on file  Occupational History  . Occupation: retired  Tobacco Use  . Smoking status: Former Smoker    Packs/day: 0.25    Years: 6.00    Pack years: 1.50    Types: Cigarettes  . Smokeless tobacco: Former Systems developer    Quit date: 03/05/1983   Vaping Use  . Vaping Use: Never used  Substance and Sexual Activity  . Alcohol use: Yes    Comment: drinks a few beers a day, states averages to 6 pack a week   . Drug use: No  . Sexual activity: Yes    Birth control/protection: None  Other Topics Concern  . Not on file  Social History Narrative  . Not on file   Social Determinants of Health   Financial Resource Strain:   . Difficulty of Paying Living Expenses: Not on file  Food Insecurity:   . Worried About Charity fundraiser in the Last Year: Not on file  . Ran Out of Food in the Last Year: Not on file  Transportation Needs:   . Lack of Transportation (Medical): Not on file  . Lack of Transportation (Non-Medical): Not on file  Physical Activity:   . Days of Exercise per Week: Not on file  . Minutes of Exercise per Session: Not on file  Stress:   . Feeling of Stress : Not on file  Social Connections:   . Frequency of Communication with Friends and Family: Not on file  . Frequency of Social Gatherings with Friends and Family: Not on file  . Attends Religious Services: Not on file  . Active Member of Clubs or Organizations: Not on file  . Attends Archivist Meetings: Not on file  . Marital Status: Not on file  Intimate Partner Violence:   . Fear of Current or Ex-Partner: Not on file  . Emotionally Abused: Not on file  . Physically Abused: Not on file  . Sexually Abused: Not on file    Family History  Problem Relation Age of Onset  . Colon cancer Neg Hx     BP (!) 169/87   Pulse 90   Ht 5\' 11"  (1.803 m)   Wt 285 lb (129.3 kg)   BMI 39.75 kg/m   Body mass index is 39.75 kg/m.      Objective:   Physical Exam Vitals and nursing note reviewed. Exam conducted with a chaperone present.  Constitutional:      Appearance: He is well-developed.  HENT:     Head: Normocephalic and atraumatic.  Eyes:     Conjunctiva/sclera: Conjunctivae normal.     Pupils: Pupils are equal, round, and reactive to  light.  Cardiovascular:     Rate and Rhythm: Normal rate and regular rhythm.  Pulmonary:     Effort: Pulmonary effort is normal.  Abdominal:     Palpations: Abdomen is soft.  Musculoskeletal:     Cervical back: Normal range of motion and neck supple.       Legs:  Skin:    General: Skin is warm and dry.  Neurological:  Mental Status: He is alert and oriented to person, place, and time.     Cranial Nerves: No cranial nerve deficit.     Motor: No abnormal muscle tone.     Coordination: Coordination normal.     Deep Tendon Reflexes: Reflexes are normal and symmetric. Reflexes normal.  Psychiatric:        Behavior: Behavior normal.        Thought Content: Thought content normal.        Judgment: Judgment normal.           Assessment & Plan:   Encounter Diagnosis  Name Primary?  . Right hamstring injury, initial encounter Yes   I have explained that it will take about another three weeks to four weeks to recover.  I have told him to take one baby aspirin a day.  Use Aspercreme, BioFreeze or Voltaren Gel to the area as needed.  Use heat or ice.  Use cane or crutch.  Return in three weeks.  Call if any problem.  Precautions discussed.   Electronically Signed Sanjuana Kava, MD 10/12/202110:20 AM

## 2019-12-06 ENCOUNTER — Ambulatory Visit: Payer: Medicare HMO | Admitting: Orthopaedic Surgery

## 2021-10-25 ENCOUNTER — Ambulatory Visit (INDEPENDENT_AMBULATORY_CARE_PROVIDER_SITE_OTHER): Payer: No Typology Code available for payment source

## 2021-10-25 ENCOUNTER — Ambulatory Visit (INDEPENDENT_AMBULATORY_CARE_PROVIDER_SITE_OTHER): Payer: No Typology Code available for payment source | Admitting: Internal Medicine

## 2021-10-25 ENCOUNTER — Encounter: Payer: Self-pay | Admitting: Internal Medicine

## 2021-10-25 DIAGNOSIS — I10 Essential (primary) hypertension: Secondary | ICD-10-CM | POA: Diagnosis not present

## 2021-10-25 DIAGNOSIS — R0609 Other forms of dyspnea: Secondary | ICD-10-CM | POA: Insufficient documentation

## 2021-10-25 LAB — BASIC METABOLIC PANEL
BUN: 15 mg/dL (ref 6–23)
CO2: 29 mEq/L (ref 19–32)
Calcium: 9.9 mg/dL (ref 8.4–10.5)
Chloride: 101 mEq/L (ref 96–112)
Creatinine, Ser: 1.1 mg/dL (ref 0.40–1.50)
GFR: 64.86 mL/min (ref >60.00)
Glucose, Bld: 91 mg/dL (ref 70–99)
Potassium: 4.6 mEq/L (ref 3.5–5.1)
Sodium: 139 mEq/L (ref 135–145)

## 2021-10-25 LAB — BRAIN NATRIURETIC PEPTIDE: Pro B Natriuretic peptide (BNP): 24 pg/mL (ref 0.0–100.0)

## 2021-10-25 LAB — CBC WITH DIFFERENTIAL/PLATELET
Basophils Absolute: 0.1 10*3/uL (ref 0.0–0.1)
Basophils Relative: 2.1 % (ref 0.0–3.0)
Eosinophils Absolute: 0.2 10*3/uL (ref 0.0–0.7)
Eosinophils Relative: 3.1 % (ref 0.0–5.0)
HCT: 41.6 % (ref 39.0–52.0)
Hemoglobin: 14 g/dL (ref 13.0–17.0)
Lymphocytes Relative: 32.1 % (ref 12.0–46.0)
Lymphs Abs: 1.6 10*3/uL (ref 0.7–4.0)
MCHC: 33.5 g/dL (ref 30.0–36.0)
MCV: 92.8 fl (ref 78.0–100.0)
Monocytes Absolute: 0.4 10*3/uL (ref 0.1–1.0)
Monocytes Relative: 9 % (ref 3.0–12.0)
Neutro Abs: 2.6 10*3/uL (ref 1.4–7.7)
Neutrophils Relative %: 53.7 % (ref 43.0–77.0)
Platelets: 235 10*3/uL (ref 150.0–400.0)
RBC: 4.49 Mil/uL (ref 4.22–5.81)
RDW: 13.5 % (ref 11.5–15.5)
WBC: 4.8 10*3/uL (ref 4.0–10.5)

## 2021-10-25 LAB — TSH: TSH: 0.98 u[IU]/mL (ref 0.35–5.50)

## 2021-10-25 MED ORDER — VALSARTAN 160 MG PO TABS: 160.0000 mg | ORAL_TABLET | Freq: Every day | ORAL | 11 refills | Status: AC

## 2021-10-25 NOTE — Assessment & Plan Note (Signed)
Body mass index is 40.59 kg/m.   No results found for: "TSH"     Contributing to doe and risk of GERD >>>   reviewed the need and the process to achieve and maintain neg calorie balance > defer f/u primary care including intermittently monitoring thyroid status      Each maintenance medication was reviewed in detail including emphasizing most importantly the difference between maintenance and prns and under what circumstances the prns are to be triggered using an action plan format where appropriate.  Total time for H and P, chart review, counseling,  directly observing portions of ambulatory 02 saturation study/ and generating customized AVS unique to this office visit / same day charting  > 45 min new pt eval

## 2021-10-25 NOTE — Progress Notes (Unsigned)
Tyler Hopkins, male    DOB: 21-Jun-1945   MRN: [id number]   Brief patient profile:  75  yobm  quit smoking 1990 / retired Engineer, building services  referred to pulmonary clinic 10/25/2021 by [facility] for worse breathing x 2018 with subjective wheeze  much worse since early 2023  assoc with wt gain       History of Present Illness  10/25/2021  Pulmonary/ 1st office eval/Doil Kamara on ACEi  Chief Complaint  Patient presents with   Consult    He is having some shortness of breath with exertion x 6 months to 1 year,   Dyspnea:  mb and back uphill mb heavy breathing  Cough: sensation of something stuck in throat  Sleep: on left side flat bed/ one pillow SABA use: not helping   No obvious day to day or daytime pattern/variability or assoc excess/ purulent sputum or mucus plugs or hemoptysis or cp or chest tightness, subjective wheeze or overt sinus or hb symptoms.   Sleeping  without nocturnal  or early am exacerbation  of respiratory  c/o's or need for noct saba. Also denies any obvious fluctuation of symptoms with weather or environmental changes or other aggravating or alleviating factors except as outlined above   No unusual exposure hx or h/o childhood pna/ asthma or knowledge of premature birth.  Current Allergies, Complete Past Medical History, Past Surgical History, Family History, and Social History were reviewed in [employer] record.  ROS  The following are not active complaints unless bolded Hoarseness, sore throat, dysphagia/globus , dental problems, itching, sneezing,  nasal congestion or discharge of excess mucus or purulent secretions, ear ache,   fever, chills, sweats, unintended wt loss or wt gain, classically pleuritic or exertional cp,  orthopnea pnd or arm/hand swelling  or leg swelling, presyncope, palpitations, abdominal pain, anorexia, nausea, vomiting, diarrhea  or change in bowel habits or change in bladder habits, change in stools or change in urine, dysuria,  hematuria,  rash, arthralgias, visual complaints, headache, numbness, weakness or ataxia or problems with walking or coordination,  change in mood or  memory.              Past Medical History:  Diagnosis Date   GERD (gastroesophageal reflux disease)    Gout    Hypertension    PTSD (post-traumatic stress disorder)    Was in [place] 1966-1967, exposed to [place]   Shortness of breath    Sleep apnea    pt has sleep apnea but "could not wear machine"    Outpatient Medications Prior to Visit  Medication Sig Dispense Refill   albuterol (PROVENTIL HFA;VENTOLIN HFA) 108 (90 Base) MCG/ACT inhaler Inhale 1-2 puffs into the lungs every 6 (six) hours as needed for wheezing or shortness of breath.     amLODipine (NORVASC) 5 MG tablet Take 5 mg by mouth daily.     budesonide-formoterol (SYMBICORT) 160-4.5 MCG/ACT inhaler Inhale 2 puffs into the lungs 2 (two) times daily.     chlorthalidone (HYGROTON) 25 MG tablet Take 25 mg by mouth daily.     hydrochlorothiazide (HYDRODIURIL) 25 MG tablet Take 25 mg by mouth daily.     lisinopril (PRINIVIL,ZESTRIL) 10 MG tablet Take 10 mg by mouth daily.     naproxen (NAPROSYN) 500 MG tablet Take 500 mg by mouth 2 (two) times daily with a meal.     lidocaine (LIDODERM) 5 % Place 1 patch onto the skin daily. Remove & Discard patch within 12 hours  or as directed by MD (Patient not taking: Reported on 11/15/2019) 10 patch 0   No facility-administered medications prior to visit.     Objective:     BP 122/76 (BP Location: Left Arm, Cuff Size: Large)   Pulse 90   Temp 98.4 F (36.9 C)   Ht '5\' 11"'$  (1.803 m)   Wt 291 lb (132 kg)   SpO2 97% Comment: ra  BMI 40.59 kg/m   SpO2: 97 % (ra)  obese amb wm nad harsh upper airway cough    HEENT : Oropharynx  clar      Nasal turbinates nl    NECK :  without  apparent JVD/ palpable Nodes/TM    LUNGS: no acc muscle use,  Nl contour chest which is clear to A and P bilaterally without cough on insp or exp  maneuvers   CV:  RRR  no s3 or murmur or increase in P2, and  1+ pitting   ABD: obese  soft and nontender with nl inspiratory excursion in the supine position. No bruits or organomegaly appreciated   MS:  Nl gait/ ext warm without deformities Or obvious joint restrictions  calf tenderness, cyanosis or clubbing    SKIN: warm and dry without lesions    NEURO:  alert, approp, nl sensorium with  no motor or cerebellar deficits apparent.       Assessment   No problem-specific Assessment & Plan notes found for this encounter.     Christinia Gully, MD 10/25/2021

## 2021-10-25 NOTE — Assessment & Plan Note (Signed)
D/c acei 10/25/2021 due to cough /sob   ACE inhibitors are problematic in  pts with airway complaints because  even experienced pulmonologists can't always distinguish ace effects from copd/asthma.  By themselves they don't actually cause a problem, much like oxygen can't by itself start a fire, but they certainly serve as a powerful catalyst or enhancer for any "fire"  or inflammatory process in the upper airway, be it caused by an ET  tube or more commonly reflux (especially in the obese or pts with known GERD or who are on biphoshonates).    In the era of ARB near equivalency until we have a better handle on the reversibility of the airway problem, it just makes sense to avoid ACEI  entirely in the short run and then decide later, having established a level of airway control using a reasonable limited regimen, whether to add back ace but even then being very careful to observe the pt for worsening airway control and number of meds used/ needed to control symptoms.     >>> try valsartan 160 mg daily

## 2021-10-25 NOTE — Patient Instructions (Signed)
Stop lisinopril and start valsartan  160 mg on daily in its place and your breathing should gradually improve   Please remember to go to the lab department   for your tests - we will call you with the results when they are available.      Please remember to go to the  x-ray department  for your tests - we will call you with the results when they are available    Please schedule a follow up office visit in 5 weeks, call sooner if needed with all medications /inhalers/ solutions in hand so we can verify exactly what you are taking. This includes all medications from all doctors and over the counters   - [place] office   Addendum:  radiology concerned about L perihilar infiltrate > repeat cxr in 6 weeks in absence of clinical correlation/ pt advised 10/28/2021

## 2021-10-26 ENCOUNTER — Encounter: Payer: Self-pay | Admitting: Internal Medicine

## 2021-10-26 NOTE — Assessment & Plan Note (Addendum)
Onset 2018 / much worse since early 2023 on ACEi  - 10/25/2021   Walked on RA  x  2  lap(s) =  approx 500  ft  @ nl pace, stopped due to sob/chest tight   with lowest 02 sats 95% - try off acei 10/25/2021   Symptoms are markedly disproportionate to objective findings and not clear to what extent this is actually a pulmonary  problem but pt does appear to have difficult to sort out respiratory symptoms of unknown origin for which  DDX  = almost all start with A and  include Adherence, Ace Inhibitors, Acid Reflux, Active Sinus Disease, Alpha 1 Antitripsin deficiency, Anxiety masquerading as Airways dz,  ABPA,  Allergy(esp in young), Aspiration (esp in elderly), Adverse effects of meds,  Active smoking or Vaping, A bunch of PE's/clot burden (a few small clots can't cause this syndrome unless there is already severe underlying pulm or vascular dz with poor reserve),  Anemia or thyroid disorder, plus two Bs  = Bronchiectasis and Beta blocker use..and one C= CHF     Adherence is always the initial "prime suspect" and is a multilayered concern that requires a "trust but verify" approach in every patient - starting with knowing how to use medications, especially inhalers, correctly, keeping up with refills and understanding the fundamental difference between maintenance and prns vs those medications only taken for a very short course and then stopped and not refilled.  - advised to return with all meds in hand using a trust but verify approach to confirm accurate Medication  Reconciliation The principal here is that until we are certain that the  patients are doing what we've asked, it makes no sense to ask them to do more.   ACEi adverse effects at the  top of the usual list of suspects and the only way to rule it out is a trial off > see a/p    ? Acid (or non-acid) GERD > always difficult to exclude as up to 75% of pts in some series report no assoc GI/ Heartburn symptoms>  If not better off acei try  max (24h)   acid suppression and diet    Anemia/ thyroid dz > ruled out today   ? Allergy/asthma > doubt, will check allergy screen but not add  additional rx until acei off - ok to use hfa prn   ? A bunch of PEs  > D dimer high range of nl for age/condition >> while a high normal value (seen commonly in the elderly or chronically ill)  may miss small peripheral pe, the clot burden with sob is moderately high and the d dimer  has a very high neg pred value if used in this setting.    ? chf > excluded by cxr and low BNP / can't rule out component of angina causing chest tightness but if so this would represent chronic stable angina and not a crescendo pattern

## 2021-10-28 LAB — D-DIMER, QUANTITATIVE: D-Dimer, Quant: 1.15 mcg/mL FEU — ABNORMAL HIGH (ref <0.50)

## 2021-10-28 LAB — IGE: IgE (Immunoglobulin E), Serum: 67 kU/L (ref <=114)

## 2021-10-28 NOTE — Progress Notes (Signed)
Called and spoke with patient, provided results/recommendations per Dr. Melvyn Novas.  He verbalized understanding.  Nothing further needed.

## 2021-11-04 NOTE — Progress Notes (Signed)
Spoke with pt and notified of results per Dr. Wert. Pt verbalized understanding and denied any questions. 

## 2021-11-22 ENCOUNTER — Encounter: Payer: Self-pay | Admitting: Internal Medicine

## 2021-12-10 ENCOUNTER — Ambulatory Visit (HOSPITAL_COMMUNITY)
Admission: RE | Admit: 2021-12-10 | Discharge: 2021-12-10 | Disposition: A | Discharge status: Home / Self Care | Payer: No Typology Code available for payment source | Source: Ambulatory Visit | Attending: Internal Medicine | Admitting: Internal Medicine

## 2021-12-10 ENCOUNTER — Ambulatory Visit (INDEPENDENT_AMBULATORY_CARE_PROVIDER_SITE_OTHER): Payer: No Typology Code available for payment source | Admitting: Internal Medicine

## 2021-12-10 ENCOUNTER — Encounter: Payer: Self-pay | Admitting: Internal Medicine

## 2021-12-10 VITALS — BP 138/74 | HR 90 | Temp 97.7°F | Ht 72.0 in | Wt 298.2 lb

## 2021-12-10 DIAGNOSIS — R0609 Other forms of dyspnea: Secondary | ICD-10-CM | POA: Insufficient documentation

## 2021-12-10 DIAGNOSIS — I10 Essential (primary) hypertension: Secondary | ICD-10-CM

## 2021-12-10 MED ORDER — PANTOPRAZOLE SODIUM 40 MG PO TBEC: 40.0000 mg | DELAYED_RELEASE_TABLET | Freq: Every day | ORAL | 2 refills | Status: DC

## 2021-12-10 MED ORDER — FAMOTIDINE 20 MG PO TABS: ORAL_TABLET | ORAL | 11 refills | Status: DC

## 2021-12-10 NOTE — Patient Instructions (Addendum)
Pantoprazole (protonix) 40 mg   Take  30-60 min before first meal of the day and Pepcid (famotidine)  20 mg after supper until return to office - this is the best way to tell whether stomach acid is contributing to your problem.    GERD (REFLUX)  is an extremely common cause of respiratory symptoms just like yours , many times with no obvious heartburn at all.    It can be treated with medication, but also with lifestyle changes including elevation of the head of your bed (ideally with 6 -8inch blocks under the headboard of your bed),  Smoking cessation, avoidance of late meals, excessive alcohol, and avoid fatty foods, chocolate, peppermint, colas, red wine, and acidic juices such as orange juice.  NO MINT OR MENTHOL PRODUCTS SO NO COUGH DROPS  USE SUGARLESS CANDY INSTEAD (Jolley ranchers or Stover's or Life Savers) or even ice chips will also do - the key is to swallow to prevent all throat clearing. NO OIL BASED VITAMINS - use powdered substitutes.  Avoid fish oil when coughing.   [facility] and go the ER if you start having chest discomfort at lower levels of activity.  Please remember to go to the  x-ray department  @  [facility] for your tests - we will call you with the results when they are available     Please schedule a follow up office visit in 4 weeks, sooner if needed  with all medications /inhalers/ solutions in hand so we can verify exactly what you are taking. This includes all medications from all doctors and over the counters

## 2021-12-10 NOTE — Progress Notes (Unsigned)
Tyler Hopkins, male    DOB: Mar 19, 1945   MRN: [id number]   Brief patient profile:  58  yobm  quit smoking 1990 / retired Engineer, building services  referred to pulmonary clinic 10/25/2021 by [facility] for worse breathing x 2018 with subjective wheeze  much worse since early 2023  assoc with wt gain       History of Present Illness  10/25/2021  Pulmonary/ 1st office eval/Tyler Hopkins on ACEi  Chief Complaint  Patient presents with   Consult    He is having some shortness of breath with exertion x 6 months to 1 year,   Dyspnea:  mb and back uphill mb heavy breathing  Cough: sensation of something stuck in throat  Sleep: on left side flat bed/ one pillow SABA use: not helping  Rec Stop lisinopril and start valsartan  160 mg on daily in its place and your breathing should gradually improve  Please schedule a follow up office visit in 5 weeks, call sooner if needed with all medications /inhalers/ solutions in hand      12/10/2021  f/u ov/[name] office/Tyler Hopkins re: sob/ sp maint on GERD RX  ? [facility] / needs f/u cxr/ did not bring meds or inhalers Chief Complaint  Patient presents with   Follow-up    Breathing doing okay  Had chest pain 2 nights ago used rescue inhaler and it helped.   Dyspnea:  mb and back for same discomfort x years/ [facility] / does not think he's ever seen cards there. Cough: no mucus/ still some sensation of globus Sleeping: flat bed on side  / had one episode of chest discomfort noc relieved by inhaler but not really sure which one  SABA use: not sure what he's taking  02: none  Globus sensation / dyspphagia > to see GI in [place] by end of month     No obvious day to day or daytime variability or assoc excess/ purulent sputum or mucus plugs or hemoptysis  or overt sinus or hb symptoms.     Also denies any obvious fluctuation of symptoms with weather or environmental changes or other aggravating or alleviating factors except as outlined above   No unusual exposure  hx or h/o childhood pna/ asthma or knowledge of premature birth.  Current Allergies, Complete Past Medical History, Past Surgical History, Family History, and Social History were reviewed in [employer] record.  ROS  The following are not active complaints unless bolded Hoarseness, sore throat, dysphagia, dental problems, itching, sneezing,  nasal congestion or discharge of excess mucus or purulent secretions, ear ache,   fever, chills, sweats, unintended wt loss or wt gain, classically pleuritic   cp,  orthopnea pnd or arm/hand swelling  or leg swelling, presyncope, palpitations, abdominal pain, anorexia, nausea, vomiting, diarrhea  or change in bowel habits or change in bladder habits, change in stools or change in urine, dysuria, hematuria,  rash, arthralgias, visual complaints, headache, numbness, weakness or ataxia or problems with walking or coordination,  change in mood or  memory.        Current Meds  Medication Sig   albuterol (PROVENTIL HFA;VENTOLIN HFA) 108 (90 Base) MCG/ACT inhaler Inhale 1-2 puffs into the lungs every 6 (six) hours as needed for wheezing or shortness of breath.   amLODipine (NORVASC) 5 MG tablet Take 5 mg by mouth daily.   budesonide-formoterol (SYMBICORT) 160-4.5 MCG/ACT inhaler Inhale 2 puffs into the lungs 2 (two) times daily.  chlorthalidone (HYGROTON) 25 MG tablet Take 25 mg by mouth daily.   hydrochlorothiazide (HYDRODIURIL) 25 MG tablet Take 25 mg by mouth daily.   naproxen (NAPROSYN) 500 MG tablet Take 500 mg by mouth 2 (two) times daily with a meal.   valsartan (DIOVAN) 160 MG tablet Take 1 tablet (160 mg total) by mouth daily.             Past Medical History:  Diagnosis Date   GERD (gastroesophageal reflux disease)    Gout    Hypertension    PTSD (post-traumatic stress disorder)    Was in [place] 1966-1967, exposed to Agent Orange   Shortness of breath    Sleep apnea    pt has sleep apnea but "could not wear machine"        Objective:    Wt Readings from Last 3 Encounters:  12/10/21 298 lb 3.2 oz (135.3 kg)  10/25/21 291 lb (132 kg)  11/15/19 285 lb (129.3 kg)      Vital signs reviewed  12/10/2021  - Note at rest 02 sats  98% on RA   General appearance:    slt hoarse amb bm nad      HEENT : Oropharynx  clear      Nasal turbinates nl    NECK :  without  apparent JVD/ palpable Nodes/TM    LUNGS: no acc muscle use,  Nl contour chest which is clear to A and P bilaterally without cough on insp or exp maneuvers   CV:  RRR  no s3 or murmur or increase in P2, and 1+ pitting/ elastic hose    ABD:  soft and nontender with nl inspiratory excursion in the supine position. No bruits or organomegaly appreciated   MS:  Nl gait/ ext warm without deformities Or obvious joint restrictions  calf tenderness, cyanosis or clubbing    SKIN: warm and dry without lesions    NEURO:  alert, approp, nl sensorium with  no motor or cerebellar deficits apparent.     CXR PA and Lateral:   12/10/2021 :    I personally reviewed images and agree with radiology impression as follows:    Wnl  My review: mild T kyphosis, o/w nl     Assessment

## 2021-12-11 ENCOUNTER — Encounter: Payer: Self-pay | Admitting: Internal Medicine

## 2021-12-11 NOTE — Assessment & Plan Note (Addendum)
D/c'd  acei 10/25/2021 due to cough /sob   Although even in retrospect it may not be clear the ACEi contributed to the pt's symptoms,    adding them back at this point or in the future would risk confusion in interpretation of non-specific respiratory symptoms to which this patient is prone  ie  Better not to muddy the waters here.   >>> continue diovan 160 and f/u in [place] clinic   F/u pulmonary clinic  in 4 weeks, sooner if needed with all meds/ inhalers in hand

## 2021-12-11 NOTE — Assessment & Plan Note (Signed)
Quit smoking 1990 - Onset 2018 / much worse since early 2023 on ACEi  - 10/25/2021   Walked on RA  x  2  lap(s) =  approx 500  ft  @ nl pace, stopped due to sob/chest tight   with lowest 02 sats 95% - try off acei 10/25/2021  - 12/10/2021   Walked on RA  x  3  lap(s) =  approx 450  ft  @ fast pace, stopped due to end of study  with lowest 02 sats 97% and sob but no cp     When respiratory symptoms begin or become refractory well after a patient reports complete smoking cessation,  Especially when this wasn't the case while they were smoking, a red flag is raised based on the work of Dr Kris Mouton which states:  if you quit smoking when your best day FEV1 is still well preserved it is highly unlikely you will progress to severe disease.  That is to say, once the smoking stops,  the symptoms should not suddenly erupt years later or markedly worsen p quits.  If so, the differential diagnosis should include  obesity/deconditioning,  LPR/Reflux/Aspiration syndromes,  occult CHF/ IHD, or  especially side effect of medications commonly used in this population.    He may have an AB component but would be more concerned with GERD or IHD here and advised to continue his gerd rx and f/u and let his [place] doctor know he needs referral to cards if w/u not done in the last year (can't find anything in Care everywhere)  If chest discomfort starts occurring at rest or lower level of exertion > [facility]

## 2021-12-11 NOTE — Assessment & Plan Note (Signed)
Body mass index is 40.44 kg/m.  -  trending up Lab Results  Component Value Date   TSH 0.98 10/25/2021      Contributing to doe and  GERD and risk of dvt/pe/ihd >>>   reviewed the need and the process to achieve and maintain neg calorie balance > defer f/u primary care including intermittently monitoring thyroid status     Each maintenance medication was reviewed in detail including emphasizing most importantly the difference between maintenance and prns and under what circumstances the prns are to be triggered using an action plan format where appropriate.  Total time for H and P, chart review, counseling, reviewing hfa device(s) , directly observing portions of ambulatory 02 saturation study/ and generating customized AVS unique to this office visit / same day charting = 33 min

## 2021-12-28 NOTE — Progress Notes (Signed)
GI Office Note    Referring Provider: [facility] Primary Care Physician:  [facility]  Primary Gastroenterologist: Elon Alas. Abbey Chatters, DO  Chief Complaint   Chief Complaint  Patient presents with   Dysphagia    Food getting stuck in throat.     History of Present Illness   Tyler Hopkins is a 76 y.o. male presenting today at the request of [facility] for dysphagia.   EGD 07/27/17: -possible web at [place] junction s/p balloon dilation  Colonoscopy 07/27/17: - normal - repeat in 10 years   Per PCP notes from 11/01/21: reports food getting stuck midsternum and eventually goes down if he drinks fluids. Dysphagia worse with foods. Cold makes him feel like his esophagus tighten up. No regurg.  Today: Dysphagia - has had multiple esophageal stretching. Had 2 dilations done at the [place] in the past. Sometimes if he drinks something cold or hot it feels stuck at the bottom of his chest and couple days ago he began having some hiccups and it felt stuck for a few minutes and brought tears to his eyes. Reports he has had a BPE in the past (about 2-3 years ago). Since his last dilation he has noticed the last few months it is worse with solid foods. Denies any regurgitation. Symptoms usually occur about 2-3 times per week. Tries to chew his food good. Has occasional heartburn. Denise nausea, vomiting, over abdominal pain. Does have occasional abdominal pain but is not related to bowel movements. Pain can be sharp and quickly subside. Denies melena or BRBPR. Does have black stool if he takes pepto bismol. Does not take this often. Denies weight loss, has gained weight.   Drinks about 2 cans of beer prior to bed most days of the week.  Quit smoking 40 years ago.   Patient denies any significant shortness of breath above his baseline.  Denies any chest pain.  Does have some lower extremity swelling. Ankles swell all the time L >R. Taking HCTZ.   Current Outpatient  Medications  Medication Sig Dispense Refill   albuterol (PROVENTIL HFA;VENTOLIN HFA) 108 (90 Base) MCG/ACT inhaler Inhale 1-2 puffs into the lungs every 6 (six) hours as needed for wheezing or shortness of breath.     amLODipine (NORVASC) 5 MG tablet Take 5 mg by mouth daily.     budesonide-formoterol (SYMBICORT) 160-4.5 MCG/ACT inhaler Inhale 2 puffs into the lungs 2 (two) times daily.     hydrochlorothiazide (HYDRODIURIL) 25 MG tablet Take 25 mg by mouth daily.     valsartan (DIOVAN) 160 MG tablet Take 1 tablet (160 mg total) by mouth daily. 30 tablet 11   chlorthalidone (HYGROTON) 25 MG tablet Take 25 mg by mouth daily. (Patient not taking: Reported on 12/30/2021)     famotidine (PEPCID) 20 MG tablet One after supper (Patient not taking: Reported on 12/30/2021) 30 tablet 11   naproxen (NAPROSYN) 500 MG tablet Take 500 mg by mouth 2 (two) times daily with a meal. (Patient not taking: Reported on 12/30/2021)     pantoprazole (PROTONIX) 40 MG tablet Take 1 tablet (40 mg total) by mouth daily. Take 30-60 min before first meal of the day (Patient not taking: Reported on 12/30/2021) 30 tablet 2   No current facility-administered medications for this visit.    Past Medical History:  Diagnosis Date   GERD (gastroesophageal reflux disease)    Gout    Hypertension    PTSD (post-traumatic stress disorder)  Was in [place] 1966-1967, exposed to Agent Orange   Shortness of breath    Sleep apnea    pt has sleep apnea but "could not wear machine"    Past Surgical History:  Procedure Laterality Date   ABDOMINAL SURGERY     had chisel embedded in abdomen while working [place] the [place]   COLONOSCOPY  March 2008   received op notes from [place], [place]. multiple colon polyps,  path: fragment of serrated adenoma, tublular adenomas   COLONOSCOPY WITH PROPOFOL  03/09/2012   SLF:Two SMALL COLON POLYPS/RECTAL BLEEDING DUE TO Small internal hemorrhoids   ESOPHAGOGASTRODUODENOSCOPY (EGD) WITH PROPOFOL   03/09/2012   [id number] ring was found at the gastroesophageal junction Small hiatal hernia/MILD Gastritis    KNEE ARTHROSCOPY     right knee   POLYPECTOMY  03/09/2012   Procedure: POLYPECTOMY;  Surgeon: Danie Binder, MD;  Location: [facility];  Service: Endoscopy;  Laterality: [name];   SAVORY DILATION  03/09/2012   Procedure: SAVORY DILATION;  Surgeon: Danie Binder, MD;  Location: [facility];  Service: Endoscopy;  Laterality: [name];  14/15/16/17    Family History  Problem Relation Age of Onset   Hyperlipidemia Mother    Hypertension Mother    Hyperlipidemia Father    Hypertension Father    Colon cancer Neg Hx    Alpha-1 antitrypsin deficiency Neg Hx    Lupus Neg Hx     Allergies as of 12/30/2021   (No Known Allergies)    Social History   Socioeconomic History   Marital status: Married    Spouse name: Not on file   Number of children: Not on file   Years of education: Not on file   Highest education level: Not on file  Occupational History   Occupation: retired  Tobacco Use   Smoking status: Former    Packs/day: 0.25    Years: 6.00    Total pack years: 1.50    Types: Cigarettes   Smokeless tobacco: Former    Quit date: 03/05/1983  Vaping Use   Vaping Use: Never used  Substance and Sexual Activity   Alcohol use: Yes    Comment: drinks a few beers a day, states averages to 6 pack a week    Drug use: No   Sexual activity: Yes    Birth control/protection: None  Other Topics Concern   Not on file  Social History Narrative   Not on file   Social Determinants of Health   Financial Resource Strain: Not on file  Food Insecurity: Not on file  Transportation Needs: Not on file  Physical Activity: Not on file  Stress: Not on file  Social Connections: Not on file  Intimate Partner Violence: Not on file     Review of Systems   Gen: Denies any fever, chills, fatigue, weight loss, lack of appetite.  CV: Denies chest pain, heart palpitations, peripheral edema, syncope.   Resp: Denies shortness of breath at rest or with exertion. Denies wheezing or cough.  GI: see HPI GU : Denies urinary burning, urinary frequency, urinary hesitancy MS: Denies joint pain, muscle weakness, cramps, or limitation of movement.  Derm: Denies rash, itching, dry skin Psych: Denies depression, anxiety, memory loss, and confusion Heme: Denies bruising, bleeding, and enlarged lymph nodes.   Physical Exam   BP (!) 150/80 (BP Location: Right Arm, Patient Position: Sitting, Cuff Size: Large)   Pulse 96   Temp 97.7 F (36.5 C) (Temporal)   Ht 6' (1.829 m)  Wt 295 lb 3.2 oz (133.9 kg)   SpO2 96%   BMI 40.04 kg/m   General:   Alert and oriented. Pleasant and cooperative. Well-nourished and well-developed.  Head:  Normocephalic and atraumatic. Eyes:  Without icterus, sclera clear and conjunctiva pink.  Ears:  Normal auditory acuity. Mouth:  No deformity or lesions, oral mucosa pink.  Lungs:  Clear to auscultation bilaterally. No wheezes, rales, or rhonchi. No distress.  Heart:  S1, S2 present without murmurs appreciated.  Abdomen:  +BS, soft, non-distended.  Mild epigastric tenderness.  No HSM noted. No guarding or rebound. No masses appreciated.  Rectal:  Deferred  Msk:  Symmetrical without gross deformities. Normal posture. Extremities:  Mild non pitting edema Neurologic:  Alert and  oriented x4;  grossly normal neurologically. Skin:  Intact without significant lesions or rashes. Psych:  Alert and cooperative. Normal mood and affect.   Assessment   Tyler Hopkins is a 76 y.o. male with a history of GERD, HTN, PTSD, and gout presenting today with dysphagia.  Dysphagia, GERD: Patient is a history of esophageal webs with previous dilations.  Patient states that he had a dilation prior to 2019 as well.  He also states he has had a barium pill esophagram but was unsure of the results of this and this was performed a couple of years ago.  He has been having more frequent  episodes of food getting stuck midsternum with occasional pain and hiccups with food usually eventually goes down if he drinks fluids.  He states this is primarily exacerbated by extreme cold or hot.  He denies any regurgitation of food.  Denies any melena, BRBPR, abdominal pain, weight loss, or lack of appetite.  He does report he tries to chew his food adequately prior to swallowing.  Has some occasional heartburn symptoms but is not currently on PPI or famotidine.  He states he stopped taking these as he felt like it did not help.  He would prefer to proceed with EGD versus resuming PPI.  He does have some epigastric tenderness on exam today.  Advised him to follow GERD diet and we discussed precautions today and when to proceed to the ED.  The patient was found to have elevated blood pressure when vital signs were checked in the office. The blood pressure was rechecked by the nursing staff and it was found be persistently elevated >140/90 mmHg. I personally advised to the patient to follow up closely with his PCP for hypertension control.  PLAN   Proceed with upper endoscopy with dilation with propofol by Dr. Abbey Chatters in near future: the risks, benefits, and alternatives have been discussed with the patient in detail. The patient states understanding and desires to proceed. ASA 3 Continue dysphagia chewing precautions. GERD diet Follow up with PCP regarding BP management Follow up 3 months post procedure.    Venetia Night, MSN, FNP-BC, AGACNP-BC [facility]

## 2021-12-30 ENCOUNTER — Encounter: Payer: Self-pay | Admitting: Gastroenterology

## 2021-12-30 ENCOUNTER — Ambulatory Visit (INDEPENDENT_AMBULATORY_CARE_PROVIDER_SITE_OTHER): Payer: Medicare HMO | Admitting: Gastroenterology

## 2021-12-30 VITALS — BP 150/80 | HR 96 | Temp 97.7°F | Ht 72.0 in | Wt 295.2 lb

## 2021-12-30 DIAGNOSIS — K219 Gastro-esophageal reflux disease without esophagitis: Secondary | ICD-10-CM

## 2021-12-30 DIAGNOSIS — R1310 Dysphagia, unspecified: Secondary | ICD-10-CM

## 2021-12-30 NOTE — Patient Instructions (Addendum)
We are scheduling you for an upper endoscopy with dilation in the near future with Dr. Abbey Chatters.  You will receive separate written instructions in the mail regarding prep including a preop appointment.  Please continue to adequately chew food, and cut food into smaller bites and pieces.  Alternating small bites with sips of liquids.  If you begin to feel like food is stuck in your chest not able to go down after drinking liquids or you begin regurgitating liquids after eating, you should proceed to the nearest ED for evaluation.  Follow a GERD diet:  Avoid fried, fatty, greasy, spicy, citrus foods. Avoid caffeine and carbonated beverages. Avoid chocolate. Try eating 4-6 small meals a day rather than 3 large meals. Do not eat within 3 hours of laying down. Prop head of bed up on wood or bricks to create a 6 inch incline.  We will see you for follow-up a few months after your procedure.  We have a wonderful Christmas!  It was a pleasure to see you today. I want to create trusting relationships with patients. If you receive a survey regarding your visit,  I greatly appreciate you taking time to fill this out on paper or through your MyChart. I value your feedback.  Venetia Night, MSN, FNP-BC, AGACNP-BC [facility]

## 2022-01-02 ENCOUNTER — Telehealth (INDEPENDENT_AMBULATORY_CARE_PROVIDER_SITE_OTHER): Payer: Self-pay | Admitting: *Deleted

## 2022-01-02 ENCOUNTER — Encounter (INDEPENDENT_AMBULATORY_CARE_PROVIDER_SITE_OTHER): Payer: Self-pay | Admitting: *Deleted

## 2022-01-02 NOTE — Telephone Encounter (Signed)
Spoke with pt. Scheduled for EGD/ED with Dr. Abbey Chatters, ASA 3 on 1/15 at 8:15am. He preferred Jan. Aware will send instructions/pre-op appt.

## 2022-01-20 ENCOUNTER — Ambulatory Visit: Payer: No Typology Code available for payment source | Admitting: Internal Medicine

## 2022-01-20 NOTE — Progress Notes (Deleted)
Tyler Hopkins, male    DOB: 02/16/1945   MRN: [id number]   Brief patient profile:  22  yobm  quit smoking 1990 / retired Engineer, building services  referred to pulmonary clinic 10/25/2021 by [facility] for worse breathing x 2018 with subjective wheeze  much worse since early 2023  assoc with wt gain       History of Present Illness  10/25/2021  Pulmonary/ 1st office eval/Katasha Riga on ACEi  Chief Complaint  Patient presents with   Consult    He is having some shortness of breath with exertion x 6 months to 1 year,   Dyspnea:  mb and back uphill mb heavy breathing  Cough: sensation of something stuck in throat  Sleep: on left side flat bed/ one pillow SABA use: not helping  Rec Stop lisinopril and start valsartan  160 mg on daily in its place and your breathing should gradually improve  Please schedule a follow up office visit in 5 weeks, call sooner if needed with all medications /inhalers/ solutions in hand      12/10/2021  f/u ov/[place] office/Kemper Hochman re: sob/ sp maint on GERD RX  ? AP / needs f/u cxr/ did not bring meds or inhalers Chief Complaint  Patient presents with   Follow-up    Breathing doing okay  Had chest pain 2 nights ago used rescue inhaler and it helped.   Dyspnea:  mb and back for same discomfort x years/ [facility] / does not think he's ever seen cards there. Cough: no mucus/ still some sensation of globus Sleeping: flat bed on side  / had one episode of chest discomfort noc relieved by inhaler but not really sure which one  SABA use: not sure what he's taking  02: none  Globus sensation / dyspphagia > to see GI in [place] by end of month Rec Pantoprazole (protonix) 40 mg   Take  30-60 min before first meal of the day and Pepcid (famotidine)  20 mg after supper until return to office - this is the best way to tell whether stomach acid is contributing to your problem.   GERD diet  [facility] and [facility]  if you start having chest discomfort at lower levels of activity. Please schedule a follow up office visit in 4 weeks, sooner if needed  with all medications /inhalers/ solutions in hand     01/20/2022  f/u ov/[place] office/Kiyla Ringler re: *** maint on ***  No chief complaint on file.   Dyspnea:  *** Cough: *** Sleeping: *** SABA use: *** 02: *** Covid status: *** Lung cancer screening: ***   No obvious day to day or daytime variability or assoc excess/ purulent sputum or mucus plugs or hemoptysis or cp or chest tightness, subjective wheeze or overt sinus or hb symptoms.   *** without nocturnal  or early am exacerbation  of respiratory  c/o's or need for noct saba. Also denies any obvious fluctuation of symptoms with weather or environmental changes or other aggravating or alleviating factors except as outlined above   No unusual exposure hx or h/o childhood pna/ asthma or knowledge of premature birth.  Current Allergies, Complete Past Medical History, Past Surgical History, Family History, and Social History were reviewed in [employer] record.  ROS  The following are not active complaints unless bolded Hoarseness, sore throat, dysphagia, dental problems, itching, sneezing,  nasal congestion or  discharge of excess mucus or purulent secretions, ear ache,   fever, chills, sweats, unintended wt loss or wt gain, classically pleuritic or exertional cp,  orthopnea pnd or arm/hand swelling  or leg swelling, presyncope, palpitations, abdominal pain, anorexia, nausea, vomiting, diarrhea  or change in bowel habits or change in bladder habits, change in stools or change in urine, dysuria, hematuria,  rash, arthralgias, visual complaints, headache, numbness, weakness or ataxia or problems with walking or coordination,  change in mood or  memory.        No outpatient medications have been marked as taking for the 01/20/22 encounter (Appointment) with Tanda Rockers, MD.                Past Medical History:  Diagnosis Date   GERD (gastroesophageal reflux disease)    Gout    Hypertension    PTSD (post-traumatic stress disorder)    Was in [place] 1966-1967, exposed to Agent Orange   Shortness of breath    Sleep apnea    pt has sleep apnea but "could not wear machine"       Objective:    Wts    01/20/2022    ***   12/10/21 298 lb 3.2 oz (135.3 kg)  10/25/21 291 lb (132 kg)  11/15/19 285 lb (129.3 kg)      Vital signs reviewed  01/20/2022  - Note at rest 02 sats  ***% on ***   General appearance:    ***      1+ pitting/ elastic hose  ***         Assessment

## 2022-02-07 NOTE — Patient Instructions (Signed)
Tyler Hopkins  02/07/2022     '@PREFPERIOPPHARMACY'$ @   Your procedure is scheduled on  02/17/2022.   Report to [name] at  [place].   Call this number if you have problems the morning of surgery:  [phone]  If you experience any cold or flu symptoms such as cough, fever, chills, shortness of breath, etc. between now and your scheduled surgery, please notify us at the above number.   Remember:  Follow the diet instructions given to you by the office.        Use your inhalers before you come and bring your rescue inhaler with you.    Take these medicines the morning of surgery with A SIP OF WATER                                        amlodipine.     Do not wear jewelry, make-up or nail polish.  Do not wear lotions, powders, or perfumes, or deodorant.  Do not shave 48 hours prior to surgery.  Men may shave face and neck.  Do not bring valuables to the hospital.  [facility] is not responsible for any belongings or valuables.  Contacts, dentures or bridgework may not be worn into surgery.  Leave your suitcase in the car.  After surgery it may be brought to your room.  For patients admitted to the hospital, discharge time will be determined by your treatment team.  Patients discharged the day of surgery will not be allowed to drive home and must have someone with them for 24 hours.     Special instructions:   DO NOT smoke tobacco or vape for 24 hours before your procedure.  Please read over the following fact sheets that you were given. Anesthesia Post-op Instructions and Care and Recovery After Surgery      Upper Endoscopy, Adult, Care After After the procedure, it is common to have a sore throat. It is also common to have: Mild stomach pain or discomfort. Bloating. Nausea. Follow these instructions at home: The instructions below may help you care for yourself at home. Your health care provider may give you more instructions. If you have  questions, ask your health care provider. If you were given a sedative during the procedure, it can affect you for several hours. Do not drive or operate machinery until your health care provider says that it is safe. If you will be going home right after the procedure, plan to have a responsible adult: Take you home from the hospital or clinic. You will not be allowed to drive. Care for you for the time you are told. Follow instructions from your health care provider about what you may eat and drink. Return to your normal activities as told by your health care provider. Ask your health care provider what activities are safe for you. Take over-the-counter and prescription medicines only as told by your health care provider. Contact a health care provider if you: Have a sore throat that lasts longer than one day. Have trouble swallowing. Have a fever. Get help right away if you: Vomit blood or your vomit looks like coffee grounds. Have bloody, black, or tarry stools. Have a very bad sore throat or you cannot swallow. Have difficulty breathing or very bad pain in your chest or abdomen. These symptoms may be an emergency.  Get help right away. Call 911. Do not wait to see if the symptoms will go away. Do not drive yourself to the hospital. Summary After the procedure, it is common to have a sore throat, mild stomach discomfort, bloating, and nausea. If you were given a sedative during the procedure, it can affect you for several hours. Do not drive until your health care provider says that it is safe. Follow instructions from your health care provider about what you may eat and drink. Return to your normal activities as told by your health care provider. This information is not intended to replace advice given to you by your health care provider. Make sure you discuss any questions you have with your health care provider. Document Revised: 05/01/2021 Document Reviewed: 05/01/2021 Elsevier  Patient Education  [place]. Esophageal Dilatation Esophageal dilatation, also called esophageal dilation, is a procedure to widen or open a blocked or narrowed part of the esophagus. The esophagus is the part of the body that moves food and liquid from the mouth to the stomach. You may need this procedure if: You have a buildup of scar tissue in your esophagus that makes it difficult, painful, or impossible to swallow. This can be caused by gastroesophageal reflux disease (GERD). You have cancer of the esophagus. There is a problem with how food moves through your esophagus. In some cases, you may need this procedure repeated at a later time to dilate the esophagus gradually. Tell a health care provider about: Any allergies you have. All medicines you are taking, including vitamins, herbs, eye drops, creams, and over-the-counter medicines. Any problems you or family members have had with anesthetic medicines. Any blood disorders you have. Any surgeries you have had. Any medical conditions you have. Any antibiotic medicines you are required to take before dental procedures. Whether you are pregnant or may be pregnant. What are the risks? Generally, this is a safe procedure. However, problems may occur, including: Bleeding due to a tear in the lining of the esophagus. A hole, or perforation, in the esophagus. What happens before the procedure? Ask your health care provider about: Changing or stopping your regular medicines. This is especially important if you are taking diabetes medicines or blood thinners. Taking medicines such as aspirin and ibuprofen. These medicines can thin your blood. Do not take these medicines unless your health care provider tells you to take them. Taking over-the-counter medicines, vitamins, herbs, and supplements. Follow instructions from your health care provider about eating or drinking restrictions. Plan to have a responsible adult take you home from  the hospital or clinic. Plan to have a responsible adult care for you for the time you are told after you leave the hospital or clinic. This is important. What happens during the procedure? You may be given a medicine to help you relax (sedative). A numbing medicine may be sprayed into the back of your throat, or you may gargle the medicine. Your health care provider may perform the dilatation using various surgical instruments, such as: Simple dilators. This instrument is carefully placed in the esophagus to stretch it. Guided wire bougies. This involves using an endoscope to insert a wire into the esophagus. A dilator is passed over this wire to enlarge the esophagus. Then the wire is removed. Balloon dilators. An endoscope with a small balloon is inserted into the esophagus. The balloon is inflated to stretch the esophagus and open it up. The procedure may vary among health care providers and hospitals. What can I  expect after the procedure? Your blood pressure, heart rate, breathing rate, and blood oxygen level will be monitored until you leave the hospital or clinic. Your throat may feel slightly sore and numb. This will get better over time. You will not be allowed to eat or drink until your throat is no longer numb. When you are able to drink, urinate, and sit on the edge of the bed without nausea or dizziness, you may be able to return home. Follow these instructions at home: Take over-the-counter and prescription medicines only as told by your health care provider. If you were given a sedative during the procedure, it can affect you for several hours. Do not drive or operate machinery until your health care provider says that it is safe. Plan to have a responsible adult care for you for the time you are told. This is important. Follow instructions from your health care provider about any eating or drinking restrictions. Do not use any products that contain nicotine or tobacco, such as  cigarettes, e-cigarettes, and chewing tobacco. If you need help quitting, ask your health care provider. Keep all follow-up visits. This is important. Contact a health care provider if: You have a fever. You have pain that is not relieved by medicine. Get help right away if: You have chest pain. You have trouble breathing. You have trouble swallowing. You vomit blood. You have black, tarry, or bloody stools. These symptoms may represent a serious problem that is an emergency. Do not wait to see if the symptoms will go away. Get medical help right away. Call your local emergency services (911 in the [place] Do not drive yourself to the hospital. Summary Esophageal dilatation, also called esophageal dilation, is a procedure to widen or open a blocked or narrowed part of the esophagus. Plan to have a responsible adult take you home from the hospital or clinic. For this procedure, a numbing medicine may be sprayed into the back of your throat, or you may gargle the medicine. Do not drive or operate machinery until your health care provider says that it is safe. This information is not intended to replace advice given to you by your health care provider. Make sure you discuss any questions you have with your health care provider. Document Revised: 06/08/2019 Document Reviewed: 06/08/2019 Elsevier Patient Education  [place] After The following information offers guidance on how to care for yourself after your procedure. Your health care provider may also give you more specific instructions. If you have problems or questions, contact your health care provider. What can I expect after the procedure? After the procedure, it is common to have: Tiredness. Little or no memory about what happened during or after the procedure. Impaired judgment when it comes to making decisions. Nausea or vomiting. Some trouble with balance. Follow these instructions at  home: For the time period you were told by your health care provider:  Rest. Do not participate in activities where you could fall or become injured. Do not drive or use machinery. Do not drink alcohol. Do not take sleeping pills or medicines that cause drowsiness. Do not make important decisions or sign legal documents. Do not take care of children on your own. Medicines Take over-the-counter and prescription medicines only as told by your health care provider. If you were prescribed antibiotics, take them as told by your health care provider. Do not stop using the antibiotic even if you start to feel better. Eating and drinking Follow  instructions from your health care provider about what you may eat and drink. Drink enough fluid to keep your urine pale yellow. If you vomit: Drink clear fluids slowly and in small amounts as you are able. Clear fluids include water, ice chips, low-calorie sports drinks, and fruit juice that has water added to it (diluted fruit juice). Eat light and bland foods in small amounts as you are able. These foods include bananas, applesauce, rice, lean meats, toast, and crackers. General instructions  Have a responsible adult stay with you for the time you are told. It is important to have someone help care for you until you are awake and alert. If you have sleep apnea, surgery and some medicines can increase your risk for breathing problems. Follow instructions from your health care provider about wearing your sleep device: When you are sleeping. This includes during daytime naps. While taking prescription pain medicines, sleeping medicines, or medicines that make you drowsy. Do not use any products that contain nicotine or tobacco. These products include cigarettes, chewing tobacco, and vaping devices, such as e-cigarettes. If you need help quitting, ask your health care provider. Contact a health care provider if: You feel nauseous or vomit every time you eat  or drink. You feel light-headed. You are still sleepy or having trouble with balance after 24 hours. You get a rash. You have a fever. You have redness or swelling around the IV site. Get help right away if: You have trouble breathing. You have new confusion after you get home. These symptoms may be an emergency. Get help right away. Call 911. Do not wait to see if the symptoms will go away. Do not drive yourself to the hospital. This information is not intended to replace advice given to you by your health care provider. Make sure you discuss any questions you have with your health care provider. Document Revised: 06/17/2021 Document Reviewed: 06/17/2021 Elsevier Patient Education  [place].

## 2022-02-11 ENCOUNTER — Encounter (HOSPITAL_COMMUNITY)
Admission: RE | Admit: 2022-02-11 | Discharge: 2022-02-11 | Disposition: A | Discharge status: Home / Self Care | Payer: No Typology Code available for payment source | Source: Ambulatory Visit | Attending: Internal Medicine | Admitting: Internal Medicine

## 2022-02-11 ENCOUNTER — Encounter (HOSPITAL_COMMUNITY): Payer: Self-pay

## 2022-02-11 VITALS — BP 166/87 | HR 93 | Temp 97.7°F | Resp 18 | Ht 72.0 in | Wt 295.2 lb

## 2022-02-11 DIAGNOSIS — R9431 Abnormal electrocardiogram [ECG] [EKG]: Secondary | ICD-10-CM | POA: Insufficient documentation

## 2022-02-11 DIAGNOSIS — Z01818 Encounter for other preprocedural examination: Secondary | ICD-10-CM | POA: Diagnosis not present

## 2022-02-11 DIAGNOSIS — Z79899 Other long term (current) drug therapy: Secondary | ICD-10-CM

## 2022-02-11 DIAGNOSIS — I10 Essential (primary) hypertension: Secondary | ICD-10-CM

## 2022-02-11 HISTORY — DX: Unspecified osteoarthritis, unspecified site: M19.90

## 2022-02-11 HISTORY — DX: Depression, unspecified: F32.A

## 2022-02-11 LAB — BASIC METABOLIC PANEL
Anion gap: 8 (ref 5–15)
BUN: 17 mg/dL (ref 8–23)
CO2: 28 mmol/L (ref 22–32)
Calcium: 9 mg/dL (ref 8.9–10.3)
Chloride: 100 mmol/L (ref 98–111)
Creatinine, Ser: 0.98 mg/dL (ref 0.61–1.24)
GFR, Estimated: >60 mL/min (ref >60)
Glucose, Bld: 93 mg/dL (ref 70–99)
Potassium: 3.5 mmol/L (ref 3.5–5.1)
Sodium: 136 mmol/L (ref 135–145)

## 2022-02-12 ENCOUNTER — Encounter: Payer: Self-pay | Admitting: Internal Medicine

## 2022-02-17 ENCOUNTER — Ambulatory Visit (HOSPITAL_COMMUNITY): Payer: No Typology Code available for payment source | Admitting: Anesthesiology

## 2022-02-17 ENCOUNTER — Encounter (HOSPITAL_COMMUNITY): Payer: Self-pay

## 2022-02-17 ENCOUNTER — Encounter (HOSPITAL_COMMUNITY)
Admission: RE | Disposition: A | Discharge status: Home / Self Care | Payer: Self-pay | Source: Ambulatory Visit | Attending: Internal Medicine

## 2022-02-17 ENCOUNTER — Other Ambulatory Visit: Payer: Self-pay

## 2022-02-17 ENCOUNTER — Ambulatory Visit (HOSPITAL_COMMUNITY)
Admission: RE | Admit: 2022-02-17 | Discharge: 2022-02-17 | Disposition: A | Discharge status: Home / Self Care | Payer: No Typology Code available for payment source | Source: Ambulatory Visit | Attending: Internal Medicine | Admitting: Internal Medicine

## 2022-02-17 DIAGNOSIS — K222 Esophageal obstruction: Secondary | ICD-10-CM

## 2022-02-17 DIAGNOSIS — R1310 Dysphagia, unspecified: Secondary | ICD-10-CM | POA: Insufficient documentation

## 2022-02-17 DIAGNOSIS — K449 Diaphragmatic hernia without obstruction or gangrene: Secondary | ICD-10-CM | POA: Insufficient documentation

## 2022-02-17 DIAGNOSIS — I10 Essential (primary) hypertension: Secondary | ICD-10-CM | POA: Insufficient documentation

## 2022-02-17 DIAGNOSIS — K219 Gastro-esophageal reflux disease without esophagitis: Secondary | ICD-10-CM | POA: Insufficient documentation

## 2022-02-17 DIAGNOSIS — G4730 Sleep apnea, unspecified: Secondary | ICD-10-CM | POA: Diagnosis not present

## 2022-02-17 DIAGNOSIS — Z87891 Personal history of nicotine dependence: Secondary | ICD-10-CM

## 2022-02-17 DIAGNOSIS — Z6841 Body Mass Index (BMI) 40.0 and over, adult: Secondary | ICD-10-CM | POA: Insufficient documentation

## 2022-02-17 HISTORY — PX: ESOPHAGOGASTRODUODENOSCOPY (EGD) WITH PROPOFOL: SHX5813

## 2022-02-17 HISTORY — PX: BALLOON DILATION: SHX5330

## 2022-02-17 SURGERY — ESOPHAGOGASTRODUODENOSCOPY (EGD) WITH PROPOFOL
Anesthesia: General

## 2022-02-17 MED ORDER — PROPOFOL 10 MG/ML IV BOLUS
INTRAVENOUS | Status: DC | PRN
  Administered 2022-02-17: 50 mg via INTRAVENOUS
  Administered 2022-02-17: 40 mg via INTRAVENOUS
  Administered 2022-02-17: 100 mg via INTRAVENOUS
  Administered 2022-02-17: 50 mg via INTRAVENOUS

## 2022-02-17 MED ORDER — LACTATED RINGERS IV SOLN: INTRAVENOUS | Status: DC

## 2022-02-17 MED ORDER — LIDOCAINE HCL 1 % IJ SOLN
INTRAMUSCULAR | Status: DC | PRN
  Administered 2022-02-17: 50 mg via INTRADERMAL

## 2022-02-17 NOTE — Anesthesia Preprocedure Evaluation (Signed)
Anesthesia Evaluation  Patient identified by MRN, date of birth, ID band Patient awake    Reviewed: Allergy & Precautions, H&P , NPO status , Patient's Chart, lab work & pertinent test results, reviewed documented beta blocker date and time   Airway Mallampati: II  TM Distance: >3 FB Neck ROM: full    Dental no notable dental hx.    Pulmonary neg pulmonary ROS, shortness of breath, sleep apnea , former smoker   Pulmonary exam normal breath sounds clear to auscultation       Cardiovascular Exercise Tolerance: Good hypertension, + DOE  negative cardio ROS  Rhythm:regular Rate:Normal     Neuro/Psych  PSYCHIATRIC DISORDERS Anxiety Depression     Neuromuscular disease negative neurological ROS  negative psych ROS   GI/Hepatic negative GI ROS, Neg liver ROS,GERD  ,,  Endo/Other    Morbid obesity  Renal/GU negative Renal ROS  negative genitourinary   Musculoskeletal   Abdominal   Peds  Hematology negative hematology ROS (+)   Anesthesia Other Findings   Reproductive/Obstetrics negative OB ROS                             Anesthesia Physical Anesthesia Plan  ASA: 3  Anesthesia Plan: General   Post-op Pain Management:    Induction:   PONV Risk Score and Plan: Propofol infusion  Airway Management Planned:   Additional Equipment:   Intra-op Plan:   Post-operative Plan:   Informed Consent: I have reviewed the patients History and Physical, chart, labs and discussed the procedure including the risks, benefits and alternatives for the proposed anesthesia with the patient or authorized representative who has indicated his/her understanding and acceptance.     Dental Advisory Given  Plan Discussed with: CRNA  Anesthesia Plan Comments:        Anesthesia Quick Evaluation

## 2022-02-17 NOTE — Op Note (Signed)
[facility] Patient Name: Tyler Hopkins Procedure Date: 02/17/2022 8:21 AM MRN: [id number] Date of Birth: 12/25/1945 Attending MD: Elon Alas. Abbey Chatters , [place], [id number] CSN: [id number] Age: 77 Admit Type: Outpatient Procedure:                Upper GI endoscopy Indications:              Dysphagia Providers:                Elon Alas. Abbey Chatters, DO, Caprice Kluver, Aram Candela Referring MD:              Medicines:                See the Anesthesia note for documentation of the                            administered medications Complications:            No immediate complications. Estimated Blood Loss:     Estimated blood loss: none. Procedure:                Pre-Anesthesia Assessment:                           - The anesthesia plan was to use monitored                            anesthesia care (MAC).                           After obtaining informed consent, the endoscope was                            passed under direct vision. Throughout the                            procedure, the patient's blood pressure, pulse, and                            oxygen saturations were monitored continuously. The                            GIF-H190 ([id number]) scope was introduced through the                            mouth, and advanced to the second part of duodenum.                            The upper GI endoscopy was accomplished without                            difficulty. The patient tolerated the procedure                            well. Scope In: 8:38:41 AM Scope Out: 8:45:49 AM Total Procedure Duration: 0 hours 7 minutes 8 seconds  Findings:      A small hiatal hernia was present.  A mild Schatzki ring was found in the distal esophagus. A TTS dilator       was passed through the scope. Dilation with an 18-19-20 mm balloon       dilator was performed to 18 mm. The dilation site was examined and       showed moderate improvement in luminal narrowing. Patient with vasovagal        response with dilation to 35m (HR transiently dropped to mid 30's) with       quick recovery with deflation of balloon. Subsequent dilation not       performed.      The entire examined stomach was normal.      The duodenal bulb, first portion of the duodenum and second portion of       the duodenum were normal. Impression:               - Small hiatal hernia.                           - Mild Schatzki ring. Dilated.                           - Normal stomach.                           - Normal duodenal bulb, first portion of the                            duodenum and second portion of the duodenum.                           - No specimens collected. Moderate Sedation:      Per Anesthesia Care Recommendation:           - Patient has a contact number available for                            emergencies. The signs and symptoms of potential                            delayed complications were discussed with the                            patient. Return to normal activities tomorrow.                            Written discharge instructions were provided to the                            patient.                           - Resume previous diet.                           - Continue present medications.                           - Repeat upper endoscopy  PRN for retreatment.                           - Return to GI clinic in 3 months. Procedure Code(s):        --- Professional ---                           [phone], Esophagogastroduodenoscopy, flexible,                            transoral; with transendoscopic balloon dilation of                            esophagus (less than 30 mm diameter) Diagnosis Code(s):        --- Professional ---                           K44.9, Diaphragmatic hernia without obstruction or                            gangrene                           K22.2, Esophageal obstruction                           R13.10, Dysphagia, unspecified CPT copyright 2022 American  Medical Association. All rights reserved. The codes documented in this report are preliminary and upon coder review may  be revised to meet current compliance requirements. Elon Alas. Abbey Chatters, DO [place] Abbey Chatters, DO 02/17/2022 8:49:06 AM This report has been signed electronically. Number of Addenda: 0

## 2022-02-17 NOTE — Transfer of Care (Signed)
Immediate Anesthesia Transfer of Care Note  Patient: Tyler Hopkins  Procedure(s) Performed: ESOPHAGOGASTRODUODENOSCOPY (EGD) WITH PROPOFOL BALLOON DILATION  Patient Location: Short Stay  Anesthesia Type:General  Level of Consciousness: awake  Airway & Oxygen Therapy: Patient Spontanous Breathing  Post-op Assessment: Report given to RN and Post -op Vital signs reviewed and stable  Post vital signs: Reviewed and stable  Last Vitals:  Vitals Value Taken Time  BP 116/62 02/17/22 0848  Temp 36.9 C 02/17/22 0848  Pulse 81 02/17/22 0848  Resp 16 02/17/22 0848  SpO2 92 % 02/17/22 0848    Last Pain:  Vitals:   02/17/22 0848  TempSrc: Axillary  PainSc:          Complications: No notable events documented.

## 2022-02-17 NOTE — Discharge Instructions (Addendum)
EGD Discharge instructions Please read the instructions outlined below and refer to this sheet in the next few weeks. These discharge instructions provide you with general information on caring for yourself after you leave the hospital. Your doctor may also give you specific instructions. While your treatment has been planned according to the most current medical practices available, unavoidable complications occasionally occur. If you have any problems or questions after discharge, please call your doctor. ACTIVITY You may resume your regular activity but move at a slower pace for the next 24 hours.  Take frequent rest periods for the next 24 hours.  Walking will help expel (get rid of) the air and reduce the bloated feeling in your abdomen.  No driving for 24 hours (because of the anesthesia (medicine) used during the test).  You may shower.  Do not sign any important legal documents or operate any machinery for 24 hours (because of the anesthesia used during the test).  NUTRITION Drink plenty of fluids.  You may resume your normal diet.  Begin with a light meal and progress to your normal diet.  Avoid alcoholic beverages for 24 hours or as instructed by your caregiver.  MEDICATIONS You may resume your normal medications unless your caregiver tells you otherwise.  WHAT YOU CAN EXPECT TODAY You may experience abdominal discomfort such as a feeling of fullness or "gas" pains.  FOLLOW-UP Your doctor will discuss the results of your test with you.  SEEK IMMEDIATE MEDICAL ATTENTION IF ANY OF THE FOLLOWING OCCUR: Excessive nausea (feeling sick to your stomach) and/or vomiting.  Severe abdominal pain and distention (swelling).  Trouble swallowing.  Temperature over 101 F (37.8 C).  Rectal bleeding or vomiting of blood.    Your upper endoscopy revealed a small hiatal hernia, you did have a tightening of your esophagus called a Schatzki's ring.  I stretched this out with a balloon today.   Hopefully this helps with your swallowing.  Stomach and small bowel appeared normal.  Follow-up with GI in 3 months.  I hope you have a great rest of your week!  Elon Alas. Abbey Chatters, D.O. Gastroenterology and Hepatology [facility]

## 2022-02-17 NOTE — H&P (Signed)
Primary Care Physician:  [facility], [facility] Primary Gastroenterologist:  Dr. Abbey Chatters  Pre-Procedure History & Physical: HPI:  Tyler Hopkins is a 77 y.o. male is here for an EGD with possible dilation due to chronic dysphagia.  Past Medical History:  Diagnosis Date   Arthritis    Depression    GERD (gastroesophageal reflux disease)    Gout    Hypertension    PTSD (post-traumatic stress disorder)    Was in [place] 1966-1967, exposed to Agent Orange   Shortness of breath    Sleep apnea    pt has sleep apnea but "could not wear machine"    Past Surgical History:  Procedure Laterality Date   ABDOMINAL SURGERY     had chisel embedded in abdomen while working [place] the [employer]   COLONOSCOPY  March 2008   received op notes from [place], [place]. multiple colon polyps,  path: fragment of serrated adenoma, tublular adenomas   COLONOSCOPY WITH PROPOFOL  03/09/2012   SLF:Two SMALL COLON POLYPS/RECTAL BLEEDING DUE TO Small internal hemorrhoids   ESOPHAGOGASTRODUODENOSCOPY (EGD) WITH PROPOFOL  03/09/2012   [id number] ring was found at the gastroesophageal junction Small hiatal hernia/MILD Gastritis    KNEE ARTHROSCOPY     right knee   POLYPECTOMY  03/09/2012   Procedure: POLYPECTOMY;  Surgeon: Danie Binder, MD;  Location: [facility];  Service: Endoscopy;  Laterality: [name];   SAVORY DILATION  03/09/2012   Procedure: SAVORY DILATION;  Surgeon: Danie Binder, MD;  Location: [facility];  Service: Endoscopy;  Laterality: [name];  14/15/16/17    Prior to Admission medications   Medication Sig Start Date End Date Taking? Authorizing Provider  albuterol (PROVENTIL HFA;VENTOLIN HFA) 108 (90 Base) MCG/ACT inhaler Inhale 1-2 puffs into the lungs every 6 (six) hours as needed for wheezing or shortness of breath.   Yes [provider]  amLODipine (NORVASC) 5 MG tablet Take 5 mg by mouth daily.   Yes [provider]  budesonide-formoterol (SYMBICORT) 160-4.5 MCG/ACT inhaler Inhale 2 puffs into the  lungs 2 (two) times daily.   Yes [provider]  hydrochlorothiazide (HYDRODIURIL) 25 MG tablet Take 25 mg by mouth daily.   Yes [provider]  lisinopril (ZESTRIL) 40 MG tablet Take 40 mg by mouth daily.   Yes [provider]  valsartan (DIOVAN) 160 MG tablet Take 1 tablet (160 mg total) by mouth daily. 10/25/21  Yes Tanda Rockers, MD    Allergies as of 01/02/2022   (No Known Allergies)    Family History  Problem Relation Age of Onset   Hyperlipidemia Mother    Hypertension Mother    Hyperlipidemia Father    Hypertension Father    Colon cancer Neg Hx    Alpha-1 antitrypsin deficiency Neg Hx    Lupus Neg Hx     Social History   Socioeconomic History   Marital status: Married    Spouse name: Not on file   Number of children: Not on file   Years of education: Not on file   Highest education level: Not on file  Occupational History   Occupation: retired  Tobacco Use   Smoking status: Former    Packs/day: 0.25    Years: 6.00    Total pack years: 1.50    Types: Cigarettes   Smokeless tobacco: Former    Quit date: 03/05/1983  Vaping Use   Vaping Use: Never used  Substance and Sexual Activity   Alcohol use: Yes    Comment: drinks  a few beers a day, states averages to 6 pack a week    Drug use: No   Sexual activity: Yes    Birth control/protection: None  Other Topics Concern   Not on file  Social History Narrative   Not on file   Social Determinants of Health   Financial Resource Strain: Not on file  Food Insecurity: Not on file  Transportation Needs: Not on file  Physical Activity: Not on file  Stress: Not on file  Social Connections: Not on file  Intimate Partner Violence: Not on file    Review of Systems: General: Negative for fever, chills, fatigue, weakness. Eyes: Negative for vision changes.  ENT: Negative for hoarseness, difficulty swallowing , nasal congestion. CV: Negative for chest pain, angina, palpitations, dyspnea  on exertion, peripheral edema.  Respiratory: Negative for dyspnea at rest, dyspnea on exertion, cough, sputum, wheezing.  GI: See history of present illness. GU:  Negative for dysuria, hematuria, urinary incontinence, urinary frequency, nocturnal urination.  MS: Negative for joint pain, low back pain.  Derm: Negative for rash or itching.  Neuro: Negative for weakness, abnormal sensation, seizure, frequent headaches, memory loss, confusion.  Psych: Negative for anxiety, depression Endo: Negative for unusual weight change.  Heme: Negative for bruising or bleeding. Allergy: Negative for rash or hives.  Physical Exam: Vital signs in last 24 hours: Temp:  [98.8 F (37.1 C)] 98.8 F (37.1 C) (01/15 0648) Pulse Rate:  [89] 89 (01/15 0648) Resp:  [15] 15 (01/15 0648) BP: (170)/(87) 170/87 (01/15 0648) SpO2:  [96 %] 96 % (01/15 0648)   General:   Alert,  Well-developed, well-nourished, pleasant and cooperative in NAD Head:  Normocephalic and atraumatic. Eyes:  Sclera clear, no icterus.   Conjunctiva pink. Ears:  Normal auditory acuity. Nose:  No deformity, discharge,  or lesions. Msk:  Symmetrical without gross deformities. Normal posture. Extremities:  Without clubbing or edema. Neurologic:  Alert and  oriented x4;  grossly normal neurologically. Skin:  Intact without significant lesions or rashes. Psych:  Alert and cooperative. Normal mood and affect.   Impression/Plan: Tyler Hopkins is here for an EGD with possible dilation due to chronic dysphagia.  Risks, benefits, limitations, imponderables and alternatives regarding EGD have been reviewed with the patient. Questions have been answered. All parties agreeable.

## 2022-02-17 NOTE — Anesthesia Postprocedure Evaluation (Signed)
Anesthesia Post Note  Patient: Tyler Hopkins  Procedure(s) Performed: ESOPHAGOGASTRODUODENOSCOPY (EGD) WITH PROPOFOL BALLOON DILATION  Patient location during evaluation: Phase II Anesthesia Type: General Level of consciousness: awake Pain management: pain level controlled Vital Signs Assessment: post-procedure vital signs reviewed and stable Respiratory status: spontaneous breathing and respiratory function stable Cardiovascular status: blood pressure returned to baseline and stable Postop Assessment: no headache and no apparent nausea or vomiting Anesthetic complications: no Comments: Late entry   No notable events documented.   Last Vitals:  Vitals:   02/17/22 0851 02/17/22 0856  BP:  108/70  Pulse:    Resp:    Temp:    SpO2: 96%     Last Pain:  Vitals:   02/17/22 0854  TempSrc:   PainSc: 0-No pain                 Louann Sjogren

## 2022-02-21 ENCOUNTER — Encounter (HOSPITAL_COMMUNITY): Payer: Self-pay | Admitting: Internal Medicine

## 2022-03-03 NOTE — Progress Notes (Deleted)
Tyler Hopkins, male    DOB: [birth year]   MRN: [id number]   Brief patient profile:  56  yobm  quit smoking 1990 / retired Engineer, building services  referred to pulmonary clinic 10/25/2021 by [facility] for worse breathing x 2018 with subjective wheeze  much worse since early 2023  assoc with wt gain       History of Present Illness  10/25/2021  Pulmonary/ 1st office eval/Karmen Altamirano on ACEi  Chief Complaint  Patient presents with   Consult    He is having some shortness of breath with exertion x 6 months to 1 year,   Dyspnea:  mb and back uphill mb heavy breathing  Cough: sensation of something stuck in throat  Sleep: on left side flat bed/ one pillow SABA use: not helping  Rec Stop lisinopril and start valsartan  160 mg on daily in its place and your breathing should gradually improve  Please schedule a follow up office visit in 5 weeks, call sooner if needed with all medications /inhalers/ solutions in hand      12/10/2021  f/u ov/[place] office/Ariane Ditullio re: sob/ sp maint on GERD RX  ? [facility] / needs f/u cxr/ did not bring meds or inhalers Chief Complaint  Patient presents with   Follow-up    Breathing doing okay  Had chest pain 2 nights ago used rescue inhaler and it helped.   Dyspnea:  mb and back for same discomfort x years/ [facility] / does not think he's ever seen cards there. Cough: no mucus/ still some sensation of globus Sleeping: flat bed on side  / had one episode of chest discomfort noc relieved by inhaler but not really sure which one  SABA use: not sure what he's taking  02: none  Globus sensation / dyspphagia > to see GI in [place] by end of month Rec Pantoprazole (protonix) 40 mg   Take  30-60 min before first meal of the day and Pepcid (famotidine)  20 mg after supper until return to office - this is the best way to tell whether stomach acid is contributing to your problem.   GERD diet reviewed, bed blocks rec  [facility] and [facility] the ER if you start having chest discomfort at lower levels of activity. Please schedule a follow up office visit in 4 weeks, sooner if needed  with all medications /inhalers/ solutions in hand  03/04/2022  f/u ov/[place] office/Janson Lamar re: *** maint on ***     No chief complaint on file.   Dyspnea:  *** Cough: *** Sleeping: *** SABA use: *** 02: *** Covid status: *** Lung cancer screening: ***   No obvious day to day or daytime variability or assoc excess/ purulent sputum or mucus plugs or hemoptysis or cp or chest tightness, subjective wheeze or overt sinus or hb symptoms.   *** without nocturnal  or early am exacerbation  of respiratory  c/o's or need for noct saba. Also denies any obvious fluctuation of symptoms with weather or environmental changes or other aggravating or alleviating factors except as outlined above   No unusual exposure hx or h/o childhood pna/ asthma or knowledge of premature birth.  Current Allergies, Complete Past Medical History, Past Surgical History, Family History, and Social History were reviewed in [employer] record.  ROS  The following are not active complaints unless bolded Hoarseness, sore throat, dysphagia, dental problems, itching, sneezing,  nasal congestion or discharge of excess mucus or purulent secretions, ear ache,   fever, chills, sweats, unintended wt loss or wt gain, classically pleuritic or exertional cp,  orthopnea pnd or arm/hand swelling  or leg swelling, presyncope, palpitations, abdominal pain, anorexia, nausea, vomiting, diarrhea  or change in bowel habits or change in bladder habits, change in stools or change in urine, dysuria, hematuria,  rash, arthralgias, visual complaints, headache, numbness, weakness or ataxia or problems with walking or coordination,  change in mood or  memory.        No outpatient medications have been marked as taking for the 03/04/22 encounter (Appointment) with Tanda Rockers, MD.               Past Medical History:  Diagnosis Date   GERD (gastroesophageal reflux disease)    Gout    Hypertension    PTSD (post-traumatic stress disorder)    Was in [place] 1966-1967, exposed to Agent Orange   Shortness of breath    Sleep apnea    pt has sleep apnea but "could not wear machine"       Objective:    Wts   03/04/2022       ***   12/10/21 298 lb 3.2 oz (135.3 kg)  10/25/21 291 lb (132 kg)  11/15/19 285 lb (129.3 kg)     Vital signs reviewed  03/04/2022  - Note at rest 02 sats  ***% on ***   General appearance:    ***    1+ pitting/ elastic hose  ***       Assessment

## 2022-03-04 ENCOUNTER — Ambulatory Visit: Payer: No Typology Code available for payment source | Admitting: Internal Medicine

## 2022-03-04 NOTE — Progress Notes (Signed)
Tyler Hopkins, male    DOB: 25-Nov-1945   MRN: [id number]  Brief patient profile:  66 yobm quit smoking 1990 / retired Engineer, building services  referred to pulmonary clinic 10/25/2021 by [facility] for worse breathing x 2018 with subjective wheeze  much worse since early 2023  assoc with wt gain       History of Present Illness  10/25/2021  Pulmonary/ 1st [facility] eval/Tyler Hopkins on ACEi  Chief Complaint  Patient presents with   Consult    He is having some shortness of breath with exertion x 6 months to 1 year,   Dyspnea:  mb and back uphill mb heavy breathing  Cough: sensation of something stuck in throat  Sleep: on left side flat bed/ one pillow SABA use: not helping  Rec Stop lisinopril and start valsartan  160 mg on daily in its place and your breathing should gradually improve  Please schedule a follow up [facility] visit in 5 weeks, call sooner if needed with all medications /inhalers/ solutions in hand      12/10/2021  f/u ov/Tyler Hopkins/Nesta Kimple re: sob/ sp maint on GERD RX  ? [facility] / needs f/u cxr/ did not bring meds or inhalers Chief Complaint  Patient presents with   Follow-up    Breathing doing okay  Had chest pain 2 nights ago used rescue inhaler and it helped.   Dyspnea:  mb and back for same discomfort x years/ [facility] / does not think he's ever seen cards there. Cough: no mucus/ still some sensation of globus Sleeping: flat bed on side  / had one episode of chest discomfort noc relieved by inhaler but not really sure which one  SABA use: not sure what he's taking  02: none  Globus sensation / dyspphagia > to see GI in [place] by end of month Rec Pantoprazole (protonix) 40 mg   Take  30-60 min before first meal of the day and Pepcid (famotidine)  20 mg after supper until return to [facility] - this is the best way to tell whether stomach acid is contributing to your problem.   GERD diet reviewed, bed blocks rec  [facility] and go the ER if you start having chest discomfort at lower levels of activity. Please schedule a follow up [facility] visit in 4 weeks, sooner if needed  with all medications /inhalers/ solutions in hand     03/05/2022  f/u ov/Tyler Hopkins/Tyler [facility] re: doe  maint on symbicort 160  Chief Complaint  Patient presents with   Follow-up    No new concerns   Dyspnea:  [place] no sob or chest pain  Cough: none  Sleeping: electric bed < 30 degrees and does fine  SABA use: symbicort 160 only  02: none  Covid status: all x for last      No obvious day to day or daytime variability or assoc excess/ purulent sputum or mucus plugs or hemoptysis or cp or chest tightness, subjective wheeze or overt sinus or hb symptoms.   Sleeping  without nocturnal  or early am exacerbation  of respiratory  c/o's or need for noct saba. Also denies any obvious fluctuation of symptoms with weather or environmental changes or other aggravating or alleviating factors except as outlined above   No unusual exposure hx or h/o childhood pna/ asthma or knowledge of premature birth.  Current Allergies, Complete Past Medical History,  Past Surgical History, Family History, and Social History were reviewed in [employer] record.  ROS  The following are not active complaints unless bolded Hoarseness, sore throat, dysphagia, dental problems, itching, sneezing,  nasal congestion or discharge of excess mucus or purulent secretions, ear ache,   fever, chills, sweats, unintended wt loss or wt gain, classically pleuritic or exertional cp,  orthopnea pnd or arm/hand swelling  or leg swelling, presyncope, palpitations, abdominal pain, anorexia, nausea, vomiting, diarrhea  or change in bowel habits or change in bladder habits, change in stools or change in urine, dysuria, hematuria,  rash, arthralgias, visual complaints, headache, numbness, weakness or ataxia or problems with walking or coordination,   change in mood or  memory.        Current Meds  Medication Sig   albuterol (PROVENTIL HFA;VENTOLIN HFA) 108 (90 Base) MCG/ACT inhaler Inhale 1-2 puffs into the lungs every 6 (six) hours as needed for wheezing or shortness of breath.   amLODipine (NORVASC) 5 MG tablet Take 5 mg by mouth daily.   budesonide-formoterol (SYMBICORT) 160-4.5 MCG/ACT inhaler Inhale 2 puffs into the lungs 2 (two) times daily.   hydrochlorothiazide (HYDRODIURIL) 25 MG tablet Take 25 mg by mouth daily.        valsartan (DIOVAN) 160 MG tablet Take 1 tablet (160 mg total) by mouth daily.               Past Medical History:  Diagnosis Date   GERD (gastroesophageal reflux disease)    Gout    Hypertension    PTSD (post-traumatic stress disorder)    Was in [place] 1966-1967, exposed to Agent Orange   Shortness of breath    Sleep apnea    pt has sleep apnea but "could not wear machine"       Objective:    Wts   03/05/2022       298   12/10/21 298 lb 3.2 oz (135.3 kg)  10/25/21 291 lb (132 kg)  11/15/19 285 lb (129.3 kg)     Vital signs reviewed  03/05/2022  - Note at rest 02 sats  97% on RA   General appearance:    amb bm nad     HEENT : Oropharynx  clear         NECK :  without  apparent JVD/ palpable Nodes/TM    LUNGS: no acc muscle use,  Nl contour chest which is clear to A and P bilaterally without cough on insp or exp maneuvers   CV:  RRR  no s3 or murmur or increase in P2, and trace bilateral LE Edema  ABD: obese  soft and nontender with nl inspiratory excursion in the supine position. No bruits or organomegaly appreciated   MS:  Nl gait/ ext warm without deformities Or obvious joint restrictions  calf tenderness, cyanosis or clubbing    SKIN: warm and dry without lesions    NEURO:  alert, approp, nl sensorium with  no motor or cerebellar deficits apparent.        Assessment

## 2022-03-05 ENCOUNTER — Ambulatory Visit (INDEPENDENT_AMBULATORY_CARE_PROVIDER_SITE_OTHER): Payer: No Typology Code available for payment source | Admitting: Internal Medicine

## 2022-03-05 ENCOUNTER — Encounter: Payer: Self-pay | Admitting: Internal Medicine

## 2022-03-05 VITALS — BP 138/82 | HR 96 | Ht 72.0 in | Wt 298.6 lb

## 2022-03-05 DIAGNOSIS — R0609 Other forms of dyspnea: Secondary | ICD-10-CM

## 2022-03-05 NOTE — Assessment & Plan Note (Signed)
Onset 2018 / much worse since early 2023 on ACEi  - 10/25/2021   Walked on RA  x  2  lap(s) =  approx 500  ft  @ nl pace, stopped due to sob/chest tight   with lowest 02 sats 95% - try off acei 10/25/2021  - 12/10/2021   Walked on RA  x  3  lap(s) =  approx 450  ft  @ fast pace, stopped due to end of study  with lowest 02 sats 97% and sob but no cp   - 03/05/2022 wean off symbicort/ return in 3 months for final f/u  Much better off acei and with gerd rx'd  - The proper method of use, as well as anticipated side effects, of a metered-dose inhaler were discussed and demonstrated to the patient using teach back method.    Ok to change to symbicort 160 prn pending final f/u Based on two studies from NEJM  378; 20 p 1865 (2018) and 380 : p2020-30 (2019) in pts with mild asthma it is reasonable to use symbicort   2bid "prn" flare in this setting but I emphasized this was only shown with symbicort and takes advantage of the rapid onset of action but is not the same as "rescue therapy" but can be stopped once the acute symptoms have resolved and the need for rescue has been minimized (< 2 x weekly)            Each maintenance medication was reviewed in detail including emphasizing most importantly the difference between maintenance and prns and under what circumstances the prns are to be triggered using an action plan format where appropriate.  Total time for H and P, chart review, counseling, reviewing hfa device(s) and generating customized AVS unique to this office visit / same day charting = 21 min

## 2022-03-05 NOTE — Patient Instructions (Addendum)
Please get a copy of your Pulmonary Function tests and bring back with you on return   Also bring symbicort 160 but in meantime ok to wean off    Please schedule a follow up visit in 3 months but call sooner if needed

## 2022-04-10 ENCOUNTER — Encounter: Payer: Self-pay | Admitting: Internal Medicine

## 2022-05-08 ENCOUNTER — Ambulatory Visit (INDEPENDENT_AMBULATORY_CARE_PROVIDER_SITE_OTHER): Payer: No Typology Code available for payment source | Admitting: Gastroenterology

## 2022-05-08 ENCOUNTER — Encounter: Payer: Self-pay | Admitting: Gastroenterology

## 2022-05-08 ENCOUNTER — Encounter (INDEPENDENT_AMBULATORY_CARE_PROVIDER_SITE_OTHER): Payer: Self-pay

## 2022-05-08 VITALS — BP 148/80 | HR 84 | Temp 99.0°F | Ht 72.0 in | Wt 304.8 lb

## 2022-05-08 DIAGNOSIS — K219 Gastro-esophageal reflux disease without esophagitis: Secondary | ICD-10-CM

## 2022-05-08 DIAGNOSIS — R195 Other fecal abnormalities: Secondary | ICD-10-CM

## 2022-05-08 DIAGNOSIS — R1310 Dysphagia, unspecified: Secondary | ICD-10-CM

## 2022-05-08 MED ORDER — CETIRIZINE HCL 5 MG PO TABS: 5.0000 mg | ORAL_TABLET | Freq: Every day | ORAL | 2 refills | Status: AC

## 2022-05-08 NOTE — Patient Instructions (Signed)
We will get you scheduled for a barium pill esophagram to further evaluate your cough and feeling like something is stuck in your throat.  I suspect that this primarily has to do with allergies and your asthma so I would like for you to start a daily allergy medication.  I sent in Zyrtec for you to take 1 tablet nightly.  When you are experiencing hard stools you may take a stool softener such as Colace 100 mg tablet.  You may use generic and you can get this over-the-counter at any pharmacy.  I will be in touch with any further recommendations after I receive the results from your barium pill esophagram.  We will plan to follow-up in 6 months, sooner if needed.  Please not hesitate to reach out if any questions or concerns.  It was a pleasure to see you today. I want to create trusting relationships with patients. If you receive a survey regarding your visit,  I greatly appreciate you taking time to fill this out on paper or through your MyChart. I value your feedback.  Venetia Night, MSN, FNP-BC, AGACNP-BC [facility]

## 2022-05-08 NOTE — Progress Notes (Signed)
GI Office Note    Referring Provider: Clinic, Tyler Hopkins Primary Care Physician:  Clinic, Tyler Hopkins Primary Gastroenterologist: Tyler Hopkins. Tyler Chatters, DO  Date:  05/08/2022  ID:  Tyler Hopkins, DOB 1945/03/07, MRN [id number]   Chief Complaint   Chief Complaint  Patient presents with   Dysphagia    Patient reports since having esophagus stretched he coughs clear to white chalky substance.  Happens every day. Sometimes it feels like liquids gets stuck if drinking too fast.    Gastroesophageal Reflux    Follow up on GERD. Reports doing well without taking any meds for heartburn.    History of Present Illness  Tyler Hopkins is a 77 y.o. male with a history of HTN, GERD, PTSD, and gout presenting today for follow-up on dysphagia.  EGD 07/27/17: -possible web at [place] s/p balloon dilation   Colonoscopy 07/27/17: - normal - repeat in 10 years   Last office visit 12/30/21. Reported history of multiple esophageal stretching's in the past.  Had 2 dilations at the [place].  If he drinks something hot or cold.  Chest and then he may begin having hiccups.  Denied any nausea, vomiting, or abdominal pain.  No melena or BRBPR.  Only having black stools if he takes Pepto-Bismol.  Drinks about 2 cans of beer prior to bed most days of the week.  Scheduled for EGD with dilation with Dr. Abbey Hopkins.  Discussed dysphagia precautions.  EGD 02/17/22: -small hiatal hernia -Mild Schatzki's ring s/p dilation -Normal duodenum -Normal stomach -EGD for retreatment as needed  Today:  Has been having a white/clear phlegm at night that he needs to cough up in the mornings. At times he feels like it is stuck like he can't cough it up/ Has relief after coughing. No real issues with eating solids. Sometimes if he is eating and starts to drink something it feels like it wants to but will go down slow.   No reflux, N/V. No abdominal pain. No melena or brbpr.   At times he will have hard stools. Most days he  has a bowel movements. He reports at times if he drinks or eats dairy products that will make him go.    Current Outpatient Medications  Medication Sig Dispense Refill   albuterol (PROVENTIL HFA;VENTOLIN HFA) 108 (90 Base) MCG/ACT inhaler Inhale 1-2 puffs into the lungs every 6 (six) hours as needed for wheezing or shortness of breath.     amLODipine (NORVASC) 5 MG tablet Take 5 mg by mouth daily.     budesonide-formoterol (SYMBICORT) 160-4.5 MCG/ACT inhaler Inhale 2 puffs into the lungs 2 (two) times daily.     hydrochlorothiazide (HYDRODIURIL) 25 MG tablet Take 25 mg by mouth daily.     valsartan (DIOVAN) 160 MG tablet Take 1 tablet (160 mg total) by mouth daily. 30 tablet 11   No current facility-administered medications for this visit.    Past Medical History:  Diagnosis Date   Arthritis    Depression    GERD (gastroesophageal reflux disease)    Gout    Hypertension    PTSD (post-traumatic stress disorder)    Was in [place] 1966-1967, exposed to Agent Orange   Shortness of breath    Sleep apnea    pt has sleep apnea but "could not wear machine"    Past Surgical History:  Procedure Laterality Date   ABDOMINAL SURGERY     had chisel embedded in abdomen [place] working [place] the [place]   [place]  02/17/2022   Procedure: BALLOON DILATION;  Surgeon: Tyler Harman, DO;  [facility];  Service: Endoscopy;;   COLONOSCOPY  March 2008   received op notes from [place], [place]. multiple colon polyps,  path: fragment of serrated adenoma, tublular adenomas   COLONOSCOPY WITH PROPOFOL  03/09/2012   SLF:Two SMALL COLON POLYPS/RECTAL BLEEDING DUE TO Small internal hemorrhoids   ESOPHAGOGASTRODUODENOSCOPY (EGD) WITH PROPOFOL  03/09/2012   [id number] ring was found at the gastroesophageal junction Small hiatal hernia/MILD Gastritis    ESOPHAGOGASTRODUODENOSCOPY (EGD) WITH PROPOFOL [name] 02/17/2022   Procedure: ESOPHAGOGASTRODUODENOSCOPY (EGD) WITH PROPOFOL;  Surgeon: Tyler Harman, DO;  Location: [facility];  Service: Endoscopy;  Laterality: [name];  8:15am, asa 3   KNEE ARTHROSCOPY     right knee   POLYPECTOMY  03/09/2012   Procedure: POLYPECTOMY;  Surgeon: Tyler Binder, MD;  Location: [facility];  Service: Endoscopy;  Laterality: [name];   SAVORY DILATION  03/09/2012   Procedure: SAVORY DILATION;  Surgeon: Tyler Binder, MD;  Location: [facility];  Service: Endoscopy;  Laterality: [name];  14/15/16/17    Family History  Problem Relation Age of Onset   Hyperlipidemia Mother    Hypertension Mother    Hyperlipidemia Father    Hypertension Father    Colon cancer Neg Hx    Alpha-1 antitrypsin deficiency Neg Hx    Lupus Neg Hx     Allergies as of 05/08/2022   (No Known Allergies)    Social History   Socioeconomic History   Marital status: Married    Spouse name: Not on file   Number of children: Not on file   Years of education: Not on file   Highest education level: Not on file  Occupational History   Occupation: retired  Tobacco Use   Smoking status: Former    Packs/day: 0.25    Years: 6.00    Additional pack years: 0.00    Total pack years: 1.50    Types: Cigarettes    Passive exposure: Past   Smokeless tobacco: Former    Quit date: 03/05/1983  Vaping Use   Vaping Use: Never used  Substance and Sexual Activity   Alcohol use: Yes    Comment: drinks a few beers a day, states averages to 6 pack a week    Drug use: No   Sexual activity: Yes    Birth control/protection: None  Other Topics Concern   Not on file  Social History Narrative   Not on file   Social Determinants of Health   Financial Resource Strain: Not on file  Food Insecurity: Not on file  Transportation Needs: Not on file  Physical Activity: Not on file  Stress: Not on file  Social Connections: Not on file     Review of Systems   Gen: Denies fever, chills, anorexia. Denies fatigue, weakness, weight loss.  CV: Denies chest pain, palpitations, syncope, peripheral edema, and  claudication. Resp: Denies dyspnea at rest, cough, wheezing, coughing up blood, and pleurisy. GI: See HPI Derm: Denies rash, itching, dry skin Psych: Denies depression, anxiety, memory loss, confusion. No homicidal or suicidal ideation.  Heme: Denies bruising, bleeding, and enlarged lymph nodes.   Physical Exam   BP (!) 148/80   Pulse 84   Temp 99 F (37.2 C) (Oral)   Ht 6' (1.829 m)   Wt (!) 304 lb 12.8 oz (138.3 kg)   BMI 41.34 kg/m   General:   Alert and oriented. No distress noted. Pleasant  and cooperative.  Head:  Normocephalic and atraumatic. Eyes:  Conjuctiva clear without scleral icterus. Mouth:  Oral mucosa pink and moist. Good dentition. No lesions. Lungs: Mostly clear bilaterally.  Does have some mild rhonchi/tightness to the left upper lobe. Heart:  S1, S2 present without murmurs appreciated.  Abdomen:  +BS, soft, non-tender and non-distended. No rebound or guarding. No HSM or masses noted. Rectal: deferred Msk:  Symmetrical without gross deformities. Normal posture. Extremities:  Without edema. Neurologic:  Alert and  oriented x4 Psych:  Alert and cooperative. Normal mood and affect.   Assessment  Tyler Hopkins is a 77 y.o. male with a history of GERD, HTN, PTSD, asthma and gout presenting today for follow-up of dysphagia.  Dysphagia, GERD: History of esophageal webs and rings requiring dilation in the past.  EGD in 2019 with dilation and a recent upper endoscopy with Schatzki's ring with dilation in January.  Not currently having any significant issues with swallowing solids.  At times with breads he may have some issues with that.  And then need to drink some liquids to help it go down but overall chooses food adequately enough prior to swallowing.  Currently has been having some frequent episodes of coughing up clear/white mucus/phlegm multiple times throughout the day.  States this did not occur until after his upper endoscopy.  He is having to clear his throat  frequently.  Given his history of asthma I suspect this as well as possible seasonal allergies could be contributing to this.  We will perform barium pill esophagram to assess for any ongoing swallowing issue or possible aspiration contributing to his development of phlegm/mucus.  For now he will start a daily allergy supplement to see if this improves his symptoms.  Does has occasional hard stools, usually relieved by consumption of dairy products.  Advised that if this continues that he may use over-the-counter stool softener such as Colace.  PLAN   BPE Dysphagia precautions reinforced. Start taking Zyrtec 5 mg nightly. Follow up in 6 months.    Venetia Night, MSN, FNP-BC, AGACNP-BC [facility]

## 2022-05-12 ENCOUNTER — Encounter (HOSPITAL_COMMUNITY): Payer: Self-pay

## 2022-05-12 ENCOUNTER — Ambulatory Visit (HOSPITAL_COMMUNITY)
Admission: RE | Admit: 2022-05-12 | Discharge: 2022-05-12 | Disposition: A | Discharge status: Home / Self Care | Payer: No Typology Code available for payment source | Source: Ambulatory Visit | Attending: Gastroenterology | Admitting: Gastroenterology

## 2022-05-12 DIAGNOSIS — R1310 Dysphagia, unspecified: Secondary | ICD-10-CM

## 2022-06-08 NOTE — Progress Notes (Unsigned)
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  referred to pulmonary clinic 10/25/2021 by [facility] for worse breathing x 2018 with subjective wheeze  much worse since early 2023  assoc with wt gain       History of Present Illness  10/25/2021  Pulmonary/ 1st office eval/Rissa Turley on ACEi  Chief Complaint  Patient presents with   Consult    He is having some shortness of breath with exertion x 6 months to 1 year,   Dyspnea:  mb and back uphill mb heavy breathing  Cough: sensation of something stuck in throat  Sleep: on left side flat bed/ one pillow SABA use: not helping  Rec Stop lisinopril and start valsartan  160 mg on daily in its place and your breathing should gradually improve  Please schedule a follow up office visit in 5 weeks, call sooner if needed with all medications /inhalers/ solutions in hand      12/10/2021  f/u ov/[place] office/[name] re: sob/ sp maint on GERD RX  ? [facility] / needs f/u cxr/ did not bring meds or inhalers Chief Complaint  Patient presents with   Follow-up    Breathing doing okay  Had chest pain 2 nights ago used rescue inhaler and it helped.   Dyspnea:  mb and back for same discomfort x years/ [facility] / does not think he's ever seen cards there. Cough: no mucus/ still some sensation of globus Sleeping: flat bed on side  / had one episode of chest discomfort noc relieved by inhaler but not really sure which one  SABA use: not sure what he's taking  02: none  Globus sensation / dyspphagia > to see GI in [place] by end of month Rec Pantoprazole (protonix) 40 mg   Take  30-60 min before first meal of the day and Pepcid (famotidine)  20 mg after supper until return to office - this is the best way to tell whether stomach acid is contributing to your problem.   GERD diet reviewed, bed blocks rec  [facility] and go the ER if you start having chest discomfort at lower levels of activity. Please schedule a follow up office visit in 4 weeks, sooner if needed  with all medications /inhalers/ solutions in hand     03/05/2022  f/u ov/[place] office/[name] re: doe  maint on symbicort 160  Chief Complaint  Patient presents with   Follow-up    No new concerns   Dyspnea:  [place] no sob or chest pain  Cough: none  Sleeping: electric bed < 30 degrees and does fine  SABA use: symbicort 160 only  02: none  Covid status: all x for last  Rec Please get a copy of your Pulmonary Function tests and bring back with you on return  Also bring symbicort 160 but in meantime ok to wean off      06/09/2022  f/u ov/ office/[name] re: doe  maint on ***  No chief complaint on file.   Dyspnea:  *** Cough: *** Sleeping: *** SABA use: *** 02: *** Covid status: *** Lung cancer screening: ***    No obvious day to day or daytime variability or assoc excess/ purulent sputum or mucus plugs or hemoptysis or cp or chest tightness, subjective wheeze or  overt sinus or hb symptoms.   *** without nocturnal  or early am exacerbation  of respiratory  c/o's or need for noct saba. Also denies any obvious fluctuation of symptoms with weather or environmental changes or other aggravating or alleviating factors except as outlined above   No unusual exposure hx or h/o childhood pna/ asthma or knowledge of premature birth.  Current Allergies, Complete Past Medical History, Past Surgical History, Family History, and Social History were reviewed in [employer] record.  ROS  The following are not active complaints unless bolded Hoarseness, sore throat, dysphagia, dental problems, itching, sneezing,  nasal congestion or discharge of excess mucus or purulent secretions, ear ache,   fever, chills, sweats, unintended wt loss or wt gain, classically pleuritic or exertional cp,   orthopnea pnd or arm/hand swelling  or leg swelling, presyncope, palpitations, abdominal pain, anorexia, nausea, vomiting, diarrhea  or change in bowel habits or change in bladder habits, change in stools or change in urine, dysuria, hematuria,  rash, arthralgias, visual complaints, headache, numbness, weakness or ataxia or problems with walking or coordination,  change in mood or  memory.        No outpatient medications have been marked as taking for the 06/09/22 encounter (Appointment) with [name], MD.               Past Medical History:  Diagnosis Date   GERD (gastroesophageal reflux disease)    Gout    Hypertension    PTSD (post-traumatic stress disorder)    Was in [place] 1966-1967, exposed to Agent Orange   Shortness of breath    Sleep apnea    pt has sleep apnea but "could not wear machine"       Objective:    Wts  06/09/2022         ***  03/05/2022       298   12/10/21 298 lb 3.2 oz (135.3 kg)  10/25/21 291 lb (132 kg)  11/15/19 285 lb (129.3 kg)           trace bilateral LE Edema***         Assessment

## 2022-06-09 ENCOUNTER — Ambulatory Visit: Payer: No Typology Code available for payment source | Admitting: Internal Medicine

## 2022-06-09 ENCOUNTER — Encounter: Payer: Self-pay | Admitting: Internal Medicine

## 2022-06-09 VITALS — BP 128/76 | HR 86 | Temp 98.4°F | Wt 303.2 lb

## 2022-06-09 DIAGNOSIS — R0609 Other forms of dyspnea: Secondary | ICD-10-CM | POA: Diagnosis not present

## 2022-06-09 NOTE — Patient Instructions (Signed)
Symbicort 160  up to 2 every 12 hours as needed but especially 15 min before your most strenuous activity   Only use your albuterol as a rescue medication to be used if you can't catch your breath by resting or doing a relaxed purse lip breathing pattern.  - The less you use it, the better it will work when you need it. - Ok to use up to 2 puffs  every 4 hours if you must but call for immediate appointment if use goes up over your usual need - Don't leave home without it !!  (think of it like the spare tire for your car)   Please schedule a follow up visit in 6 months but call sooner if needed    - bring all inhalers

## 2022-06-09 NOTE — Assessment & Plan Note (Signed)
Onset 2018 / much worse since early 2023 on ACEi - [facility]  FEV1 2.43 (80%) with ratio 0.54 / "saba responsive/ with  air trapping"  - 10/25/2021   Walked on RA  x  2  lap(s) =  approx 500  ft  @ nl pace, stopped due to sob/chest tight   with lowest 02 sats 95% - try off acei 10/25/2021  - 12/10/2021   Walked on RA  x  3  lap(s) =  approx 450  ft  @ fast pace, stopped due to end of study  with lowest 02 sats 97% and sob but no cp   - 03/05/2022 wean off symbicort/ return in 3 months and return with inhalers - 06/09/2022  did not bring inhalers as rec - not using either symbicort 160 or alb correctly by hx   His doe is likely more related to wt than asthma but hard to tell from his hx  Ok to use symbiocrt prn Based on two studies from NEJM  378; 20 p 1865 (2018) and 380 : p2020-30 (2019) in pts with mild asthma it is reasonable to use low dose symbicort eg 80 2bid "prn" flare in this setting but I emphasized this was only shown with symbicort and takes advantage of the rapid onset of action but is not the same as "rescue therapy" but can be stopped once the acute symptoms have resolved and the need for rescue has been minimized (< 2 x weekly)    However, if using symbicort prn, encouraged to use it at least 15 min before exertion and if notices significant improvement should be using it at least AM dose since much more active during the day vs noct with goal of "being all he can be" (the [employer], 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   F/u 6 m, sooner prn       Each maintenance medication was reviewed in detail including emphasizing most importantly the difference between maintenance and prns and under what circumstances the prns are to be triggered using an action plan format where appropriate.  Total time for H and P, chart review, counseling, reviewing  2 separate   hfa device(s) and generating customized  AVS unique to this office visit / same day charting = 36 min

## 2022-11-10 ENCOUNTER — Ambulatory Visit: Payer: No Typology Code available for payment source | Admitting: Gastroenterology

## 2022-12-11 ENCOUNTER — Ambulatory Visit (INDEPENDENT_AMBULATORY_CARE_PROVIDER_SITE_OTHER): Payer: Medicare HMO | Admitting: Internal Medicine

## 2022-12-11 ENCOUNTER — Encounter: Payer: Self-pay | Admitting: Internal Medicine

## 2022-12-11 VITALS — BP 148/75 | HR 93 | Ht 72.0 in | Wt 313.0 lb

## 2022-12-11 DIAGNOSIS — M1990 Unspecified osteoarthritis, unspecified site: Secondary | ICD-10-CM | POA: Insufficient documentation

## 2022-12-11 DIAGNOSIS — R0609 Other forms of dyspnea: Secondary | ICD-10-CM

## 2022-12-11 DIAGNOSIS — E559 Vitamin D deficiency, unspecified: Secondary | ICD-10-CM | POA: Insufficient documentation

## 2022-12-11 DIAGNOSIS — M1A09X0 Idiopathic chronic gout, multiple sites, without tophus (tophi): Secondary | ICD-10-CM | POA: Insufficient documentation

## 2022-12-11 DIAGNOSIS — E538 Deficiency of other specified B group vitamins: Secondary | ICD-10-CM | POA: Insufficient documentation

## 2022-12-11 DIAGNOSIS — F4310 Post-traumatic stress disorder, unspecified: Secondary | ICD-10-CM | POA: Insufficient documentation

## 2022-12-11 DIAGNOSIS — G4730 Sleep apnea, unspecified: Secondary | ICD-10-CM | POA: Insufficient documentation

## 2022-12-11 DIAGNOSIS — N35919 Unspecified urethral stricture, male, unspecified site: Secondary | ICD-10-CM | POA: Insufficient documentation

## 2022-12-11 DIAGNOSIS — J4530 Mild persistent asthma, uncomplicated: Secondary | ICD-10-CM | POA: Insufficient documentation

## 2022-12-11 DIAGNOSIS — M179 Osteoarthritis of knee, unspecified: Secondary | ICD-10-CM | POA: Insufficient documentation

## 2022-12-11 DIAGNOSIS — E7800 Pure hypercholesterolemia, unspecified: Secondary | ICD-10-CM | POA: Insufficient documentation

## 2022-12-11 DIAGNOSIS — D179 Benign lipomatous neoplasm, unspecified: Secondary | ICD-10-CM | POA: Insufficient documentation

## 2022-12-11 DIAGNOSIS — R601 Generalized edema: Secondary | ICD-10-CM | POA: Insufficient documentation

## 2022-12-11 DIAGNOSIS — E785 Hyperlipidemia, unspecified: Secondary | ICD-10-CM | POA: Insufficient documentation

## 2022-12-11 DIAGNOSIS — H2513 Age-related nuclear cataract, bilateral: Secondary | ICD-10-CM | POA: Insufficient documentation

## 2022-12-11 DIAGNOSIS — D649 Anemia, unspecified: Secondary | ICD-10-CM | POA: Insufficient documentation

## 2022-12-11 MED ORDER — BREZTRI AEROSPHERE 160-9-4.8 MCG/ACT IN AERO: 2.0000 | INHALATION_SPRAY | Freq: Two times a day (BID) | RESPIRATORY_TRACT | Status: AC

## 2022-12-11 MED ORDER — BUDESONIDE-FORMOTEROL FUMARATE 160-4.5 MCG/ACT IN AERO: 2.0000 | INHALATION_SPRAY | Freq: Two times a day (BID) | RESPIRATORY_TRACT | 11 refills | Status: AC

## 2022-12-11 NOTE — Progress Notes (Signed)
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 10/25/2021 by [facility] for worse breathing x 2018 with subjective wheeze  much worse since early 2023  assoc with wt gain       History of Present Illness  10/25/2021  Pulmonary/ 1st office eval/Urania Pearlman on ACEi  Chief Complaint  Patient presents with   Consult    He is having some shortness of breath with exertion x 6 months to 1 year,   Dyspnea:  mb and back uphill mb heavy breathing  Cough: sensation of something stuck in throat  Sleep: on left side flat bed/ one pillow SABA use: not helping  Rec Stop lisinopril and start valsartan  160 mg on daily in its place and your breathing should gradually improve  Please schedule a follow up office visit in 5 weeks, call sooner if needed with all medications /inhalers/ solutions in hand       06/09/2022  f/u ov/[place] office/Reece Mcbroom re: doe  maint on prn symbicort 160  Chief Complaint  Patient presents with   Follow-up    SOB with exertion, prod cough with cream colored sputum and occ wheezing.   Dyspnea:  [place] - [place] in [place]  House to mb 75 ft slt uphill never uses symbicort 160 before   Cough: worse p EGD  x one month / was supposed to do Dg Es / more often  Sleeping: electric bed 30 degrees s noctural symptoms on cpap but not consistently  SABA use: not using  02: none  Rec Symbicort 160  up to 2 every 12 hours as needed but especially 15 min before your most strenuous activity  Only use your albuterol as a rescue medication   Please schedule a follow up visit in 6 months but call sooner if needed    - bring all inhalers         12/11/2022  f/u ov/[place] office/Milla Wahlberg re: doe with GOLD 1 copd criteria  maint on symbicort 160   Chief Complaint  Patient presents with   Shortness of Breath  Dyspnea:  no ex but really  Not limited by breathing from desired  activities   Cough: none  Sleeping: 30 degrees elevated hob  cpap prn s    resp cc  SABA use: prn  02: none      No obvious day to day or daytime variability or assoc excess/ purulent sputum or mucus plugs or hemoptysis or cp or chest tightness, subjective wheeze or overt sinus or hb symptoms.    Also denies any obvious fluctuation of symptoms with weather or environmental changes or other aggravating or alleviating factors except as outlined above   No unusual exposure hx or h/o childhood pna/ asthma or knowledge of premature birth.  Current Allergies, Complete Past Medical History, Past Surgical History, Family History, and Social History were reviewed in [employer] record.  ROS  The following are not active complaints unless bolded Hoarseness, sore throat, dysphagia, dental problems, itching, sneezing,  nasal congestion or discharge of excess mucus or purulent secretions, ear ache,   fever, chills, sweats, unintended wt loss or wt gain, classically pleuritic or exertional cp,  orthopnea pnd or arm/hand swelling  or leg swelling, presyncope, palpitations, abdominal pain, anorexia, nausea, vomiting, diarrhea  or change in bowel habits or change in bladder habits,  change in stools or change in urine, dysuria, hematuria,  rash, arthralgias, visual complaints, headache, numbness, weakness or ataxia or problems with walking or coordination,  change in mood or  memory.        Current Meds  Medication Sig   albuterol (PROVENTIL HFA;VENTOLIN HFA) 108 (90 Base) MCG/ACT inhaler Inhale 1-2 puffs into the lungs every 6 (six) hours as needed for wheezing or shortness of breath.   amLODipine (NORVASC) 5 MG tablet Take 5 mg by mouth daily.   budesonide-formoterol (SYMBICORT) 160-4.5 MCG/ACT inhaler Inhale 2 puffs into the lungs 2 (two) times daily.   cetirizine (ZYRTEC) 5 MG tablet Take 1 tablet (5 mg total) by mouth daily.   hydrochlorothiazide (HYDRODIURIL) 25 MG tablet Take 25  mg by mouth daily.   valsartan (DIOVAN) 160 MG tablet Take 1 tablet (160 mg total) by mouth daily.              Past Medical History:  Diagnosis Date   GERD (gastroesophageal reflux disease)    Gout    Hypertension    PTSD (post-traumatic stress disorder)    Was in [place] 1966-1967, exposed to Agent Orange   Shortness of breath    Sleep apnea    pt has sleep apnea but "could not wear machine"       Objective:    Wts  12/11/2022       313  06/09/2022         303  03/05/2022       298   12/10/21 298 lb 3.2 oz (135.3 kg)  10/25/21 291 lb (132 kg)  11/15/19 285 lb (129.3 kg)    Vital signs reviewed  12/11/2022  - Note at rest 02 sats  94% on RA   General appearance:    amb MO (by BMI)  bm  nad          HEENT : Oropharynx  nl          NECK :  without  apparent JVD/ palpable Nodes/TM    LUNGS: no acc muscle use,  Nl contour chest which is clear to A and P bilaterally without cough on insp or exp maneuvers   CV:  RRR  no s3 or murmur or increase in P2, and trace to 1+ bilateral LE edema   ABD: obese  soft and nontender  MS:  Nl gait/ ext warm without deformities Or obvious joint restrictions  calf tenderness, cyanosis or clubbing    SKIN: warm and dry without lesions    NEURO:  alert, approp, nl sensorium with  no motor or cerebellar deficits apparent.          Assessment

## 2022-12-11 NOTE — Patient Instructions (Signed)
Symbicort 160 can be used up to 2 puffs every 12 hours for a full week and then ok to use up to 4 puffs a day as needed    Work on inhaler technique:  relax and gently blow all the way out then take a nice smooth full deep breath back in, triggering the inhaler at same time you start breathing in.  Hold breath in for at least  5 seconds if you can. Blow out symbicort  thru nose. Rinse and gargle with water when done.  If mouth or throat bother you at all,  try brushing teeth/gums/tongue with arm and hammer toothpaste/ make a slurry and gargle and spit out.

## 2022-12-12 ENCOUNTER — Telehealth: Payer: Self-pay

## 2022-12-12 NOTE — Assessment & Plan Note (Signed)
Onset 2018 / much worse since early 2023 on ACEi - [facility]  FEV1 2.43 (80%) with ratio 0.54 / saba response/ with  air trapping  - 10/25/2021   Walked on RA  x  2  lap(s) =  approx 500  ft  @ nl pace, stopped due to sob/chest tight   with lowest 02 sats 95% - try off acei 10/25/2021  - 12/10/2021   Walked on RA  x  3  lap(s) =  approx 450  ft  @ fast pace, stopped due to end of study  with lowest 02 sats 97% and sob but no cp   - 03/05/2022 wean off symbicort/ return in 3 months and return with inhalers - 06/09/2022  did not bring inhalers as rec - not using either symbicort 160 or alb correctly by hx  - 12/11/2022  After extensive coaching inhaler device,  effectiveness =    75% from a basline< 50 %   All goals of chronic asthma control met including optimal function and elimination of symptoms with minimal need for rescue therapy.  Contingencies discussed in full including contacting this office immediately if not controlling the symptoms using the rule of two's.     Ok to change to symbicort  160  up to 2 bid as has min copd with AB component that is only mild  F/u can be yearly prn refills if not provided by [place]

## 2022-12-12 NOTE — Assessment & Plan Note (Signed)
Body mass index is 42.45 kg/m.  -  trending up Lab Results  Component Value Date   TSH 0.98 10/25/2021      Contributing to doe and risk of GERD/dvt/pe >>>   reviewed the need and the process to achieve and maintain neg calorie balance > defer f/u primary care including intermittently monitoring thyroid status             Each maintenance medication was reviewed in detail including emphasizing most importantly the difference between maintenance and prns and under what circumstances the prns are to be triggered using an action plan format where appropriate.  Total time for H and P, chart review, counseling, reviewing hfa device(s) and generating customized AVS unique to this office visit / same day charting = 25 min

## 2022-12-12 NOTE — Telephone Encounter (Signed)
Per [facility] Symbicort is not approved until patient has tried/failed Wixela.  Please advise, thank you!

## 2022-12-12 NOTE — Telephone Encounter (Signed)
Spoke with patient and explained fax received from [place] [facility]. He does not want to use local pharmacy and [facility]. We provided Breztri sample at [facility] yesterday, advised patient to try this for 2 weeks and let us know how he is doing. If Markus Daft is working well we can send that to [place] and see if they will cover, otherwise will switch to [employer]. Patient voiced understanding and will call us in 2 weeks.

## 2022-12-12 NOTE — Telephone Encounter (Signed)
Can bet it thru insurance or go with wixella 250 one bid but pharmacy will need to show him how it works

## 2023-10-07 DIAGNOSIS — F4310 Post-traumatic stress disorder, unspecified: Secondary | ICD-10-CM | POA: Insufficient documentation

## 2023-10-07 DIAGNOSIS — G603 Idiopathic progressive neuropathy: Secondary | ICD-10-CM | POA: Insufficient documentation

## 2023-10-07 DIAGNOSIS — M25512 Pain in left shoulder: Secondary | ICD-10-CM | POA: Insufficient documentation

## 2023-10-09 ENCOUNTER — Ambulatory Visit (INDEPENDENT_AMBULATORY_CARE_PROVIDER_SITE_OTHER): Admitting: Orthopedic Surgery

## 2023-10-09 VITALS — Ht 72.0 in | Wt 307.0 lb

## 2023-10-09 DIAGNOSIS — M25512 Pain in left shoulder: Secondary | ICD-10-CM

## 2023-10-09 DIAGNOSIS — S46012A Strain of muscle(s) and tendon(s) of the rotator cuff of left shoulder, initial encounter: Secondary | ICD-10-CM

## 2023-10-09 NOTE — Progress Notes (Signed)
  Intake history:  Chief Complaint  Patient presents with   Shoulder Pain    L after throwing trash, felt immediate pain and hasn't been able to lift arm since. Pain is getting worse     Ht 6' (1.829 m)   Wt (!) 307 lb (139.3 kg)   BMI 41.64 kg/m  Body mass index is 41.64 kg/m.    WHAT ARE WE SEEING YOU FOR TODAY?   left shoulder  How long has this bothered you? (DOI?DOS?WS?)  on 09/01/23  Was there an injury? Yes  Anticoag.  No  Diabetes No  Heart disease No  Hypertension Yes  SMOKING HX No  Kidney disease No  Any ALLERGIES ______________________________________________   Treatment:  Have you taken:  Tylenol Yes  Advil No  Had PT No  Had injection No  Other  _________________________

## 2023-10-09 NOTE — Progress Notes (Signed)
 Office Visit Note   Patient: Tyler Hopkins           Date of Birth: 09-15-1945           MRN: [id number] Visit Date: 10/09/2023 Requested by: [facility] [address] [facility] [place],  [place] [address] PCP: [facility]   Assessment & Plan:   Encounter Diagnoses  Name Primary?   Acute pain of left shoulder Yes   Traumatic complete tear of left rotator cuff, initial encounter     No orders of the defined types were placed in this encounter.   79 year old male history of previous right rotator cuff repair presents with acute pain left shoulder after trauma throwing out some trash.  He felt something tear and rip in his shoulder on July 29 and since that time has been unable to lift his left shoulder.  Recommend MRI to rule out rotator cuff tear  Follow-up for results and plan for further treatment   Subjective: Chief Complaint  Patient presents with   Shoulder Pain    L after throwing trash, felt immediate pain and hasn't been able to lift arm since. Pain is getting worse    HPI: As noted above acute pain left shoulder throwing out trash cannot lift arm above head  78 year old male  Previous history of right rotator cuff repair              ROS: Shortness of breath on occasion secondary to asthma handled easily with inhaler   Images personally read and my interpretation : Plain films are in the media section normal glenohumeral joint on x-ray  Visit Diagnoses:  1. Acute pain of left shoulder   2. Traumatic complete tear of left rotator cuff, initial encounter      Follow-Up Instructions: Return for TEST RESULTS, MRI RESULTS, LEFT, SHOULDER.    Objective: Vital Signs: Ht 6' (1.829 m)   Wt (!) 307 lb (139.3 kg)   BMI 41.64 kg/m   Physical Exam Vitals and nursing note reviewed.  Constitutional:      Appearance: Normal appearance.     Comments: Above normal weight  HENT:     Head: Normocephalic and atraumatic.  Eyes:      General: No scleral icterus.       Right eye: No discharge.        Left eye: No discharge.     Extraocular Movements: Extraocular movements intact.     Conjunctiva/sclera: Conjunctivae normal.     Pupils: Pupils are equal, round, and reactive to light.  Cardiovascular:     Rate and Rhythm: Normal rate.     Pulses: Normal pulses.  Skin:    General: Skin is warm and dry.     Capillary Refill: Capillary refill takes less than 2 seconds.  Neurological:     General: No focal deficit present.     Mental Status: He is alert and oriented to person, place, and time.  Psychiatric:        Mood and Affect: Mood normal.        Behavior: Behavior normal.        Thought Content: Thought content normal.        Judgment: Judgment normal.      Left Shoulder Exam   Tenderness  Left shoulder tenderness location: tenderness left shoulder.  Range of Motion  Passive abduction:  70  Extension:  30  External rotation:  20  Forward flexion:  80   Muscle Strength  Abduction: 2/5  Internal rotation: 4/5  Supraspinatus: 2/5  Subscapularis: 5/5  Biceps: 5/5   Tests  Drop arm: positive  Other  Erythema: absent Scars: absent Sensation: normal Pulse: present        Specialty Comments:  No specialty comments available.  Imaging: No results found.   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, bilateral 12/11/2022   Posttraumatic stress disorder 12/11/2022   Hypercholesterolemia 12/11/2022   Hyperlipidemia 12/11/2022   Idiopathic chronic gout, multiple sites, without tophus (tophi) 12/11/2022   Generalized edema 12/11/2022   Lipoma 12/11/2022   Mild persistent asthma  12/11/2022   Normocytic normochromic anemia 12/11/2022   Sleep apnea 12/11/2022   Urethral stricture 12/11/2022   Vitamin B12 deficiency (non anemic) 12/11/2022   Vitamin D deficiency 12/11/2022   DOE (dyspnea on exertion) 10/25/2021   Essential hypertension 10/25/2021   Morbid obesity due to excess calories (HCC) 10/25/2021   Chronic bilateral low back pain with bilateral sciatica 10/23/2014   Neck pain, chronic 10/23/2014   Adenomatous polyp of colon 07/07/2012   Dysphagia 03/01/2012   Rectal bleeding 03/01/2012   Accidental exposure to phenoxyacid derivative herbicide 10/04/1964   Past Medical History:  Diagnosis Date   Arthritis    Depression    GERD (gastroesophageal reflux disease)    Gout    Hypertension    PTSD (post-traumatic stress disorder)    Was in [place] 1966-[place], exposed to Agent Orange   Shortness of breath    Sleep apnea    pt has sleep apnea but could not wear machine    Family History  Problem Relation Age of Onset   Hyperlipidemia Mother    Hypertension Mother    Hyperlipidemia Father    Hypertension Father    Colon cancer Neg Hx    Alpha-1 antitrypsin deficiency Neg Hx    Lupus Neg Hx     Past Surgical History:  Procedure Laterality Date   ABDOMINAL SURGERY     had chisel embedded in abdomen while working [place] the [employer]   BALLOON DILATION  02/17/2022   Procedure: BALLOON DILATION;  Surgeon: Cindie Carlin POUR, DO;  Location: [facility];  Service: Endoscopy;;   COLONOSCOPY  March 2008   received op notes from [place], [place]. multiple colon polyps,  path: fragment of serrated adenoma, tublular adenomas   COLONOSCOPY WITH PROPOFOL   03/09/2012   SLF:Two SMALL COLON POLYPS/RECTAL BLEEDING DUE TO Small internal hemorrhoids   ESOPHAGOGASTRODUODENOSCOPY (EGD) WITH PROPOFOL   03/09/2012   DOQ:Dryjusxp ring was found at the gastroesophageal junction Small hiatal hernia/MILD Gastritis    ESOPHAGOGASTRODUODENOSCOPY (EGD) WITH PROPOFOL  [name] 02/17/2022    Procedure: ESOPHAGOGASTRODUODENOSCOPY (EGD) WITH PROPOFOL ;  Surgeon: Cindie Carlin POUR, DO;  Location: [facility];  Service: Endoscopy;  Laterality: [name];  8:15am, asa 3   KNEE ARTHROSCOPY     right knee   POLYPECTOMY  03/09/2012   Procedure: POLYPECTOMY;  Surgeon: Margo LITTIE Haddock, MD;  Location: [facility];  Service: Endoscopy;  Laterality: [name];   SAVORY DILATION  03/09/2012   Procedure: SAVORY DILATION;  Surgeon: Margo LITTIE Haddock, MD;  Location: [facility];  Service: Endoscopy;  Laterality: [name];  14/15/16/17   Social History   Occupational History   Occupation: retired  Tobacco Use   Smoking status: Former    Current packs/day: 0.25    Average packs/day: 0.3 packs/day for 6.0 years (1.5 ttl pk-yrs)    Types: Cigarettes    Passive exposure: Past   Smokeless tobacco: Former    Quit date: 03/05/1983  Vaping Use   Vaping status: Never Used  Substance and Sexual Activity   Alcohol use: Yes    Comment: drinks a few beers a day, states averages to 6 pack a week    Drug use: No   Sexual activity: Yes    Birth control/protection: None

## 2023-10-09 NOTE — Patient Instructions (Signed)
 While we are working on your approval for MRI please go ahead and call to schedule your appointment with [facility] within at least one (1) week.   Central Scheduling [phone]

## 2023-10-16 ENCOUNTER — Ambulatory Visit (HOSPITAL_COMMUNITY)
Admission: RE | Admit: 2023-10-16 | Discharge: 2023-10-16 | Disposition: A | Discharge status: Home / Self Care | Source: Ambulatory Visit | Attending: Orthopedic Surgery | Admitting: Orthopedic Surgery

## 2023-10-16 DIAGNOSIS — M25512 Pain in left shoulder: Secondary | ICD-10-CM | POA: Insufficient documentation

## 2023-11-16 ENCOUNTER — Encounter: Payer: Self-pay | Admitting: Orthopedic Surgery

## 2023-11-16 ENCOUNTER — Ambulatory Visit (INDEPENDENT_AMBULATORY_CARE_PROVIDER_SITE_OTHER): Admitting: Orthopedic Surgery

## 2023-11-16 DIAGNOSIS — S46012D Strain of muscle(s) and tendon(s) of the rotator cuff of left shoulder, subsequent encounter: Secondary | ICD-10-CM | POA: Diagnosis not present

## 2023-11-16 DIAGNOSIS — M19012 Primary osteoarthritis, left shoulder: Secondary | ICD-10-CM

## 2023-11-16 NOTE — Progress Notes (Signed)
    11/16/2023   Chief Complaint  Patient presents with   Shoulder Pain    Left / reviewMRI    No diagnosis found.  What pharmacy do you use ? ________V A___________________  DOI/DOS/   Unchanged  Patient comes in today for MRI results of his left shoulder for ongoing left shoulder pain.  He is a 78 year old male with a previous history of right rotator cuff repair presents with acute pain left shoulder after throwing out some trash.  He felt something tear and rip in his shoulder on approximately July 29 and since that time he had pseudo palsy of the left shoulder  His MRI shows a massive cuff tear with significant retraction and glenohumeral arthritis   He does have an appointment tomorrow at the [facility] to see an orthopedist there.  MRI as reported IMPRESSION: 1. Severe supraspinatus tendinosis with a complete full-thickness, full width tear and 2.5 cm of retraction. 2. Severe infraspinatus tendinosis with a high-grade partial-thickness articular surface tear along the anterior half of the insertion. 3. Moderate subscapularis tendinosis with a high-grade partial-thickness bursal surface and articular surface tears. 4. Severe tendinosis of the intra-articular portion of the long head of the biceps tendon. 5. Moderate osteoarthritis of the glenohumeral joint.     Electronically Signed   By: Julaine Blanch M.D.   On: 10/17/2023 08:04  Our recommendation would be to perform a reverse total shoulder arthroplasty secondary to the significance of the tear and the arthritis  He will let us  know how he wants to proceed after his visit with the [facility] tomorrow

## 2023-11-16 NOTE — Progress Notes (Signed)
    11/16/2023   Chief Complaint  Patient presents with   Shoulder Pain    Left / reviewMRI    No diagnosis found.  What pharmacy do you use ? ________V A___________________  DOI/DOS/   Unchanged
